# Patient Record
Sex: Female | Born: 1975 | Race: Black or African American | Hispanic: No | Marital: Single | Smoking: Light tobacco smoker
Health system: Southern US, Community
[De-identification: ages and names within clinical notes are randomized; demographics above are authoritative.]

## PROBLEM LIST (undated history)

## (undated) DIAGNOSIS — F329 Major depressive disorder, single episode, unspecified: Secondary | ICD-10-CM

## (undated) DIAGNOSIS — J45909 Unspecified asthma, uncomplicated: Secondary | ICD-10-CM

## (undated) DIAGNOSIS — F419 Anxiety disorder, unspecified: Secondary | ICD-10-CM

## (undated) DIAGNOSIS — F1020 Alcohol dependence, uncomplicated: Secondary | ICD-10-CM

## (undated) DIAGNOSIS — F32A Depression, unspecified: Secondary | ICD-10-CM

## (undated) HISTORY — DX: Alcohol dependence, uncomplicated: F10.20

## (undated) HISTORY — DX: Depression, unspecified: F32.A

## (undated) HISTORY — PX: INGUINAL HIDRADENITIS EXCISION: SHX1827

## (undated) HISTORY — DX: Unspecified asthma, uncomplicated: J45.909

## (undated) HISTORY — DX: Major depressive disorder, single episode, unspecified: F32.9

## (undated) HISTORY — PX: BREAST CYST ASPIRATION: SHX578

## (undated) HISTORY — PX: CYST REMOVAL NECK: SHX6281

## (undated) HISTORY — PX: KNEE SURGERY: SHX244

## (undated) HISTORY — DX: Anxiety disorder, unspecified: F41.9

---

## 2010-12-26 ENCOUNTER — Emergency Department (HOSPITAL_COMMUNITY): Payer: Self-pay

## 2010-12-26 ENCOUNTER — Encounter: Payer: Self-pay | Admitting: Emergency Medicine

## 2010-12-26 ENCOUNTER — Emergency Department (HOSPITAL_COMMUNITY)
Admission: EM | Admit: 2010-12-26 | Discharge: 2010-12-26 | Disposition: A | Discharge status: Home / Self Care | Payer: Self-pay | Attending: Emergency Medicine | Admitting: Emergency Medicine

## 2010-12-26 DIAGNOSIS — R269 Unspecified abnormalities of gait and mobility: Secondary | ICD-10-CM | POA: Insufficient documentation

## 2010-12-26 DIAGNOSIS — M25469 Effusion, unspecified knee: Secondary | ICD-10-CM | POA: Insufficient documentation

## 2010-12-26 DIAGNOSIS — S99919A Unspecified injury of unspecified ankle, initial encounter: Secondary | ICD-10-CM | POA: Insufficient documentation

## 2010-12-26 DIAGNOSIS — W010XXA Fall on same level from slipping, tripping and stumbling without subsequent striking against object, initial encounter: Secondary | ICD-10-CM | POA: Insufficient documentation

## 2010-12-26 DIAGNOSIS — S8990XA Unspecified injury of unspecified lower leg, initial encounter: Secondary | ICD-10-CM | POA: Insufficient documentation

## 2010-12-26 DIAGNOSIS — S8991XA Unspecified injury of right lower leg, initial encounter: Secondary | ICD-10-CM

## 2010-12-26 DIAGNOSIS — M25569 Pain in unspecified knee: Secondary | ICD-10-CM | POA: Insufficient documentation

## 2010-12-26 MED ORDER — IBUPROFEN 600 MG PO TABS: 600.0000 mg | ORAL_TABLET | Freq: Three times a day (TID) | ORAL | Status: AC

## 2010-12-26 MED ORDER — OXYCODONE-ACETAMINOPHEN 5-325 MG PO TABS
1.0000 | ORAL_TABLET | Freq: Once | ORAL | Status: AC
  Administered 2010-12-26: 1 via ORAL
  Filled 2010-12-26: qty 1

## 2010-12-26 MED ORDER — TRAMADOL HCL 50 MG PO TABS: 50.0000 mg | ORAL_TABLET | Freq: Four times a day (QID) | ORAL | Status: AC | PRN

## 2010-12-26 NOTE — Progress Notes (Signed)
Orthopedic Tech Progress Note Patient Details:  Sydney Beck 06, 1977 [id number]  Other Ortho Devices Type of Ortho Device: Knee Immobilizer;Crutches Ortho Device Location: right leg Ortho Device Interventions: Application   [name] 12/26/2010, 10:33 PM

## 2010-12-26 NOTE — ED Notes (Signed)
Pt discharged with immobilizer in place and crutches. Pt AOx4, resp e/u, NAD, pt states understanding of discharge instructions and denies questions at time of discharge.

## 2010-12-26 NOTE — ED Notes (Signed)
Ortho paged and will bring immobilizer and crutches to bedside.

## 2010-12-26 NOTE — ED Provider Notes (Signed)
Medical screening examination/treatment/procedure(s) were performed by non-physician practitioner and as supervising physician I was immediately available for consultation/collaboration.   Nelia Shi, MD 12/26/10 2322

## 2010-12-26 NOTE — ED Provider Notes (Signed)
History     CSN: [id number] Arrival date & time: 12/26/2010  6:28 PM   None     Chief Complaint  Patient presents with  . Fall  . Knee Pain    (Consider location/radiation/quality/duration/timing/severity/associated sxs/prior treatment) Patient is a 35 y.o. female presenting with fall and knee pain. The history is provided by the patient.  Fall The accident occurred 3 to 5 hours ago. The fall occurred while walking. She fell from a height of 3 to 5 ft. She landed on grass. There was no blood loss. The pain is present in the right knee. The pain is severe. She was ambulatory at the scene. There was no entrapment after the fall. There was no drug use involved in the accident. There was no alcohol use involved in the accident. Pertinent negatives include no fever, no numbness and no tingling. The symptoms are aggravated by activity and extension. She has tried NSAIDs for the symptoms. The treatment provided moderate relief.  Knee Pain Associated symptoms include joint swelling. Pertinent negatives include no chills, fever, numbness or weakness.  Pt was walking her dog on a hill PTA and slipped on wet hay that was placed on the ground. Fell to the ground and experienced twisting injury to right knee. Ambulatory immediately after incident but currently unable to bear weight.  History reviewed. No pertinent past medical history.  History reviewed. No pertinent past surgical history.  Family History  Problem Relation Age of Onset  . Diabetes Father     History  Substance Use Topics  . Smoking status: Not on file  . Smokeless tobacco: Not on file  . Alcohol Use:      Review of Systems  Constitutional: Negative for fever and chills.  Musculoskeletal: Positive for joint swelling and gait problem. Negative for back pain.       Positive right knee pain  Neurological: Negative for dizziness, tingling, syncope, weakness and numbness.  Hematological: Does not bruise/bleed easily.  All  other systems reviewed and are negative.    Allergies  Bee venom and Sulfa antibiotics  Home Medications   Current Outpatient Rx  Name Route Sig Dispense Refill  . ALBUTEROL SULFATE HFA 108 (90 BASE) MCG/ACT IN AERS Inhalation Inhale 2 puffs into the lungs every 6 (six) hours as needed. For shortness of breath     . IBUPROFEN 200 MG PO TABS Oral Take 200 mg by mouth every 6 (six) hours as needed. For pain       BP 123/78  Pulse 79  Temp(Src) 98.4 F (36.9 C) (Oral)  Resp 18  SpO2 100%  LMP 11/25/2010  Physical Exam  Constitutional: She is oriented to person, place, and time. She appears well-developed and well-nourished. No distress.  HENT:  Head: Normocephalic and atraumatic.  Eyes: Pupils are equal, round, and reactive to light.  Neck: Normal range of motion. Neck supple.  Cardiovascular: Normal rate.   Pulmonary/Chest: Effort normal and breath sounds normal.  Abdominal: Soft. She exhibits no distension. There is no tenderness.  Musculoskeletal:       Right knee: She exhibits decreased range of motion and swelling. She exhibits no ecchymosis, no laceration, no erythema, no LCL laxity, normal patellar mobility and no MCL laxity. tenderness found. Lateral joint line tenderness noted. No medial joint line and no patellar tendon tenderness noted.       All other joints with full active ROM without tenderness. Distal pulses 2+ bilaterally.  Neurological: She is alert and oriented to person, place,  and time. No cranial nerve deficit.  Skin: Skin is warm and dry. No rash noted. No erythema.  Psychiatric: Her behavior is normal.    ED Course  Procedures (including critical care time)  Labs Reviewed - No data to display Dg Knee Complete 4 Views Right  12/26/2010  *RADIOLOGY REPORT*  Clinical Data: Right knee pain after fall and twisting injury today.  RIGHT KNEE - COMPLETE 4+ VIEW  Comparison: None.  Findings: No fracture, dislocation, or appreciable joint effusion.   IMPRESSION: Normal exam.  Original Report Authenticated By: Gwynn Burly, M.D.     1. Right knee injury       MDM  X-ray reviewed, no acute findings. Have advised ortho f/u if no improvement in several days.        Elwyn Reach [place], [place] 12/26/10 2132

## 2010-12-26 NOTE — ED Notes (Signed)
Pt reports fell down a hill and twisted right knee.

## 2010-12-26 NOTE — ED Notes (Signed)
Pt transported to xray 

## 2011-04-21 ENCOUNTER — Encounter (HOSPITAL_COMMUNITY): Payer: Self-pay

## 2011-04-21 ENCOUNTER — Emergency Department (HOSPITAL_COMMUNITY)
Admission: EM | Admit: 2011-04-21 | Discharge: 2011-04-21 | Disposition: A | Discharge status: Home / Self Care | Payer: Self-pay | Attending: Emergency Medicine | Admitting: Emergency Medicine

## 2011-04-21 DIAGNOSIS — R12 Heartburn: Secondary | ICD-10-CM | POA: Insufficient documentation

## 2011-04-21 DIAGNOSIS — R42 Dizziness and giddiness: Secondary | ICD-10-CM | POA: Insufficient documentation

## 2011-04-21 DIAGNOSIS — R109 Unspecified abdominal pain: Secondary | ICD-10-CM | POA: Insufficient documentation

## 2011-04-21 DIAGNOSIS — R112 Nausea with vomiting, unspecified: Secondary | ICD-10-CM | POA: Insufficient documentation

## 2011-04-21 DIAGNOSIS — K2971 Gastritis, unspecified, with bleeding: Secondary | ICD-10-CM | POA: Insufficient documentation

## 2011-04-21 LAB — URINALYSIS, ROUTINE W REFLEX MICROSCOPIC
Glucose, UA: NEGATIVE mg/dL
Leukocytes, UA: NEGATIVE
Nitrite: NEGATIVE
Specific Gravity, Urine: 1.022 (ref 1.005–1.030)
pH: 8.5 — ABNORMAL HIGH (ref 5.0–8.0)

## 2011-04-21 LAB — POCT I-STAT, CHEM 8
Chloride: 109 mEq/L (ref 96–112)
Glucose, Bld: 84 mg/dL (ref 70–99)
HCT: 42 % (ref 36.0–46.0)
Hemoglobin: 14.3 g/dL (ref 12.0–15.0)
Potassium: 4 mEq/L (ref 3.5–5.1)

## 2011-04-21 LAB — POCT PREGNANCY, URINE: Preg Test, Ur: NEGATIVE

## 2011-04-21 MED ORDER — ONDANSETRON HCL 8 MG PO TABS: 8.0000 mg | ORAL_TABLET | Freq: Once | ORAL | Status: DC

## 2011-04-21 MED ORDER — GI COCKTAIL ~~LOC~~
30.0000 mL | Freq: Once | ORAL | Status: AC
  Administered 2011-04-21: 30 mL via ORAL
  Filled 2011-04-21: qty 30

## 2011-04-21 MED ORDER — FAMOTIDINE 20 MG PO TABS: 20.0000 mg | ORAL_TABLET | Freq: Two times a day (BID) | ORAL | Status: DC

## 2011-04-21 MED ORDER — ONDANSETRON HCL 4 MG/2ML IJ SOLN
INTRAMUSCULAR | Status: AC
  Administered 2011-04-21: 4 mg
  Filled 2011-04-21: qty 2

## 2011-04-21 MED ORDER — ONDANSETRON 8 MG PO TBDP: 8.0000 mg | ORAL_TABLET | Freq: Three times a day (TID) | ORAL | Status: AC | PRN

## 2011-04-21 MED ORDER — PANTOPRAZOLE SODIUM 40 MG IV SOLR
40.0000 mg | Freq: Once | INTRAVENOUS | Status: AC
  Administered 2011-04-21: 40 mg via INTRAVENOUS
  Filled 2011-04-21: qty 40

## 2011-04-21 NOTE — Discharge Instructions (Signed)
Gastritis °Gastritis is an inflammation (the body's way of reacting to injury and/or infection) of the stomach. It is often caused by viral or bacterial (germ) infections. It can also be caused by chemicals (including alcohol) and medications. This illness may be associated with generalized malaise (feeling tired, not well), cramps, and fever. The illness may last 2 to 7 days. If symptoms of gastritis continue, gastroscopy (looking into the stomach with a telescope-like instrument), biopsy (taking tissue samples), and/or blood tests may be necessary to determine the cause. Antibiotics will not affect the illness unless there is a bacterial infection present. One common bacterial cause of gastritis is an organism known as H. Pylori. This can be treated with antibiotics. Other forms of gastritis are caused by too much acid in the stomach. They can be treated with medications such as H2 blockers and antacids. Home treatment is usually all that is needed. Young children will quickly become dehydrated (loss of body fluids) if vomiting and diarrhea are both present. Medications may be given to control nausea. Medications are usually not given for diarrhea unless especially bothersome. Some medications slow the removal of the virus from the gastrointestinal tract. This slows down the healing process. °HOME CARE INSTRUCTIONS °Home care instructions for nausea and vomiting: °· For adults: drink small amounts of fluids often. Drink at least 2 quarts a day. Take sips frequently. Do not drink large amounts of fluid at one time. This may worsen the nausea.  °· Only take over-the-counter or prescription medicines for pain, discomfort, or fever as directed by your caregiver.  °· Drink clear liquids only. Those are anything you can see through such as water, broth, or soft drinks.  °· Once you are keeping clear liquids down, you may start full liquids, soups, juices, and ice cream or sherbet. Slowly add bland (plain, not spicy)  foods to your diet.  °Home care instructions for diarrhea: °· Diarrhea can be caused by bacterial infections or a virus. Your condition should improve with time, rest, fluids, and/or anti-diarrheal medication.  °· Until your diarrhea is under control, you should drink clear liquids often in small amounts. Clear liquids include: water, broth, jell-o water and weak tea.  °Avoid: °· Milk.  °· Fruits.  °· Tobacco.  °· Alcohol.  °· Extremely hot or cold fluids.  °· Too much intake of anything at one time.  °When your diarrhea stops you may add the following foods, which help the stool to become more formed: °· Rice.  °· Bananas.  °· Apples without skin.  °· Dry toast.  °Once these foods are tolerated you may add low-fat yogurt and low-fat cottage cheese. They will help to restore the normal bacterial balance in your bowel. °Wash your hands well to avoid spreading bacteria (germ) or virus. °SEEK IMMEDIATE MEDICAL CARE IF:  °· You are unable to keep fluids down.  °· Vomiting or diarrhea become persistent (constant).  °· Abdominal pain develops, increases, or localizes. (Right sided pain can be appendicitis. Left sided pain in adults can be diverticulitis.)  °· You develop a fever (an oral temperature above 102° F (38.9° C)).  °· Diarrhea becomes excessive or contains blood or mucus.  °· You have excessive weakness, dizziness, fainting or extreme thirst.  °· You are not improving or you are getting worse.  °· You have any other questions or concerns.  °Document Released: 01/25/2001 Document Revised: 01/20/2011 Document Reviewed: 01/31/2005 °ExitCare® Patient Information ©2012 [employer]. ° °RESOURCE GUIDE ° °Dental Problems ° °Patients with   Medicaid: °[facility]                     [facility] °[address]                                           [address] °Phone:  [phone]                                                  Phone:  [phone] ° °If unable to pay or uninsured, contact:   [facility] or [facility] to become qualified for the adult dental clinic. ° °Chronic Pain Problems °Contact [facility]  [phone] °Patients need to be referred by their primary care doctor. ° °Insufficient Money for Medicine °Contact [employer]:  call "211" or [facility] 271-5999. ° °No Primary Care Doctor °Call [facility]  832-8000 °Other agencies that provide inexpensive medical care °   [facility]  [phone] °   [facility]  832-7272 °   [facility]  [phone] °   [facility]  832-4777 °   [facility]  [phone] °   [facility]  [phone] ° °Psychological Services °[facility]  [phone] °[facility]  [phone] °[facility]   [phone] (emergency services [phone]) ° °Substance Abuse Resources °[facility]  336-882-2125 °[facility] 336-784-9470 °[facility] 336-285-9073 °[facility] 336-845-3988 °Residential & Outpatient Substance Abuse Program  800-659-3381 ° °Abuse/Neglect °[place] [facility] (336) 641-3795 °[place] [facility] Hotline 800-378-5315 (After Hours) ° °Emergency Shelter °[facility] (336) 271-5985 ° °Maternity Homes °[facility] (336) 275-9566 °[facility] (704) 372-4663 ° °MRSA Hotline #:   832-7006 ° ° ° °[place] [facility] ° °[facility] [facility]     [employer]                          [facility] °[address] [place]                       [address]      [address]  °[place]                                                [place]                            [place] °Phone:  [phone]                                   Phone:  [phone]                 Phone:  342-8140 ° °[facility] °Phone:  342-8316 ° °[place] Child [facility] Hotline °(336)    342-1394 °(336) 342-3537 (After Hours) ° ° °

## 2011-04-21 NOTE — ED Notes (Signed)
Pt states that she was drinking tequila last night and ever since she woke up this morning she has been having dizziness, nausea, vomiting, and upper abdominal pain. Alert and oriented. She tried to tolerate gatorade and antacid with no relief.

## 2011-04-21 NOTE — ED Provider Notes (Signed)
History     CSN: [id number]  Arrival date & time 04/21/11  1626   First MD Initiated Contact with Patient 04/21/11 1741      Chief Complaint  Patient presents with  . Dizziness  . Abdominal Pain  . Nausea    (Consider location/radiation/quality/duration/timing/severity/associated sxs/prior treatment) Patient is a 36 y.o. female presenting with abdominal pain. The history is provided by the patient. No language interpreter was used.  Abdominal Pain The primary symptoms of the illness include abdominal pain, nausea and vomiting. The current episode started 6 to 12 hours ago. The onset of the illness was sudden. The problem has been gradually worsening.  The illness is associated with alcohol use. The patient has not had a change in bowel habit. Additional symptoms associated with the illness include heartburn.    History reviewed. No pertinent past medical history.  History reviewed. No pertinent past surgical history.  Family History  Problem Relation Age of Onset  . Diabetes Father     History  Substance Use Topics  . Smoking status: Former Games developer  . Smokeless tobacco: Not on file  . Alcohol Use: Yes    OB History    Grav Para Term Preterm Abortions TAB SAB Ect Mult Living                  Review of Systems  Gastrointestinal: Positive for heartburn, nausea, vomiting and abdominal pain.  All other systems reviewed and are negative.    Allergies  Bee venom and Sulfa antibiotics  Home Medications   Current Outpatient Rx  Name Route Sig Dispense Refill  . ALBUTEROL SULFATE HFA 108 (90 BASE) MCG/ACT IN AERS Inhalation Inhale 2 puffs into the lungs every 6 (six) hours as needed. For shortness of breath     . APPLE CIDER VINEGAR 188 MG PO CAPS Oral Take 1 capsule by mouth daily.    . B COMPLEX PO TABS Oral Take 1 tablet by mouth daily.    . IBUPROFEN 200 MG PO TABS Oral Take 200 mg by mouth every 6 (six) hours as needed. For pain     . ADULT MULTIVITAMIN  W/MINERALS CH Oral Take 1 tablet by mouth daily.      BP 119/70  Pulse 74  Resp 16  SpO2 100%  LMP 03/30/2011  Physical Exam  Nursing note and vitals reviewed. Constitutional: She is oriented to person, place, and time. She appears well-developed and well-nourished.  HENT:  Head: Normocephalic and atraumatic.  Eyes: Conjunctivae are normal. Pupils are equal, round, and reactive to light.  Neck: Normal range of motion. Neck supple.  Cardiovascular: Normal rate, regular rhythm, normal heart sounds and intact distal pulses.   Pulmonary/Chest: Effort normal and breath sounds normal.  Abdominal: Soft. She exhibits no distension. There is no rebound and no guarding.  Musculoskeletal: Normal range of motion.  Neurological: She is alert and oriented to person, place, and time.  Skin: Skin is warm and dry.  Psychiatric: She has a normal mood and affect. Her behavior is normal. Judgment and thought content normal.    ED Course  Procedures (including critical care time)  Labs Reviewed - No data to display No results found.   No diagnosis found.  Likely gastritis.  Will check urine and istat.  Patient has received zofran, fluid bolus, protonix, GI cocktail.  Tolerating ice chips without difficulty at present.  Nausea controlled. Feels better.  MDM          Jimmye Norman,  NP 04/21/11 2004

## 2011-04-22 NOTE — ED Provider Notes (Signed)
Medical screening examination/treatment/procedure(s) were performed by non-physician practitioner and as supervising physician I was immediately available for consultation/collaboration.  Toy Baker, MD 04/22/11 [phone]

## 2011-06-14 ENCOUNTER — Emergency Department (HOSPITAL_COMMUNITY): Payer: Self-pay

## 2011-06-14 ENCOUNTER — Encounter (HOSPITAL_COMMUNITY): Payer: Self-pay | Admitting: Emergency Medicine

## 2011-06-14 ENCOUNTER — Emergency Department (HOSPITAL_COMMUNITY)
Admission: EM | Admit: 2011-06-14 | Discharge: 2011-06-14 | Disposition: A | Discharge status: Home / Self Care | Payer: Self-pay | Attending: Emergency Medicine | Admitting: Emergency Medicine

## 2011-06-14 DIAGNOSIS — S8390XA Sprain of unspecified site of unspecified knee, initial encounter: Secondary | ICD-10-CM

## 2011-06-14 DIAGNOSIS — IMO0002 Reserved for concepts with insufficient information to code with codable children: Secondary | ICD-10-CM | POA: Insufficient documentation

## 2011-06-14 DIAGNOSIS — W19XXXA Unspecified fall, initial encounter: Secondary | ICD-10-CM | POA: Insufficient documentation

## 2011-06-14 MED ORDER — OXYCODONE-ACETAMINOPHEN 5-325 MG PO TABS
1.0000 | ORAL_TABLET | Freq: Once | ORAL | Status: AC
  Administered 2011-06-14: 1 via ORAL
  Filled 2011-06-14: qty 1

## 2011-06-14 MED ORDER — NAPROXEN 500 MG PO TABS: 500.0000 mg | ORAL_TABLET | Freq: Two times a day (BID) | ORAL | Status: AC

## 2011-06-14 MED ORDER — OXYCODONE-ACETAMINOPHEN 5-325 MG PO TABS: 1.0000 | ORAL_TABLET | Freq: Four times a day (QID) | ORAL | Status: AC | PRN

## 2011-06-14 NOTE — ED Notes (Signed)
Reports injured right knee in Nov, got better but never completely back to normal, 6 days ago sat funny & it "locked up" on her. Now c/o pain & difficulty ambulating due to pain. No obvious edema, states is using a walker

## 2011-06-14 NOTE — ED Notes (Signed)
Ortho tech reports did not have knee sleeve to fit pt. Ace wrap applied in replacement

## 2011-06-14 NOTE — ED Notes (Signed)
Patient transported to X-ray 

## 2011-06-14 NOTE — ED Provider Notes (Signed)
History     CSN: [id number]  Arrival date & time 06/14/11  1542   First MD Initiated Contact with Patient 06/14/11 1700      Chief Complaint  Patient presents with  . Leg Pain    (Consider location/radiation/quality/duration/timing/severity/associated sxs/prior treatment) Patient is a 36 y.o. female presenting with leg pain. The history is provided by the patient (pt states her right knee hurt when she straightened it .). No language interpreter was used.  Leg Pain  The incident occurred more than 2 days ago. The incident occurred at home. Injury mechanism: injured with use. The pain is present in the right knee. The quality of the pain is described as aching. The pain is at a severity of 4/10. The pain is moderate. Associated symptoms include inability to bear weight. Pertinent negatives include no numbness. She reports no foreign bodies present. The symptoms are aggravated by activity. The treatment provided moderate relief.    History reviewed. No pertinent past medical history.  History reviewed. No pertinent past surgical history.  Family History  Problem Relation Age of Onset  . Diabetes Father     History  Substance Use Topics  . Smoking status: Former Games developer  . Smokeless tobacco: Not on file  . Alcohol Use: Yes    OB History    Grav Para Term Preterm Abortions TAB SAB Ect Mult Living                  Review of Systems  Constitutional: Negative for fatigue.  HENT: Negative for congestion, sinus pressure and ear discharge.   Eyes: Negative for discharge.  Respiratory: Negative for cough.   Cardiovascular: Negative for chest pain.  Gastrointestinal: Negative for abdominal pain and diarrhea.  Genitourinary: Negative for frequency and hematuria.  Musculoskeletal: Positive for joint swelling. Negative for back pain.  Skin: Negative for rash.  Neurological: Negative for seizures, numbness and headaches.  Hematological: Negative.   Psychiatric/Behavioral:  Negative for hallucinations.    Allergies  Bee venom and Sulfa antibiotics  Home Medications   Current Outpatient Rx  Name Route Sig Dispense Refill  . FLUTICASONE PROPIONATE 50 MCG/ACT NA SUSP Nasal Place 2 sprays into the nose daily.    Marland Kitchen NAPROXEN 500 MG PO TABS Oral Take 1 tablet (500 mg total) by mouth 2 (two) times daily. 30 tablet 0  . OXYCODONE-ACETAMINOPHEN 5-325 MG PO TABS Oral Take 1 tablet by mouth every 6 (six) hours as needed for pain. 30 tablet 0    BP 110/73  Pulse 71  Temp(Src) 98.1 F (36.7 C) (Oral)  Resp 20  SpO2 100%  LMP 05/25/2011  Physical Exam  Constitutional: She is oriented to person, place, and time. She appears well-developed.  HENT:  Head: Normocephalic.  Eyes: Conjunctivae are normal.  Neck: No tracheal deviation present.  Cardiovascular:  No murmur heard. Musculoskeletal:       Swollen tender right knee  Neurological: She is oriented to person, place, and time.  Skin: Skin is warm.  Psychiatric: She has a normal mood and affect.    ED Course  Procedures (including critical care time)  Labs Reviewed - No data to display Dg Knee Complete 4 Views Right  06/14/2011  *RADIOLOGY REPORT*  Clinical Data: Post fall 6 months ago with persistent popping in the left knee  RIGHT KNEE - COMPLETE 4+ VIEW  Comparison: 12/26/2010  Findings: No fracture dislocation.  Joint spaces are preserved.  No evidence of chondrocalcinosis.  Interval development of a small suprapatellar  joint effusion.  Regional soft tissues are normal.  IMPRESSION: Small suprapatellar joint effusion otherwise, unremarkable examination of the knee.  Original Report Authenticated By: Waynard Reeds, M.D.     1. Knee sprain       MDM          Benny Lennert, MD 06/14/11 Rickey Primus

## 2011-06-14 NOTE — ED Notes (Signed)
Onset November 2012 right knee pain. States couple of days ago sitting on legs and extended right knee it it locked up and painful. Pain 7/10 achy dull pain right knee.

## 2011-06-14 NOTE — ED Notes (Signed)
ortho tech informed of need for knee sleeve

## 2011-06-14 NOTE — Discharge Instructions (Signed)
Follow up with dr. duda next week. °

## 2013-06-25 ENCOUNTER — Emergency Department (HOSPITAL_COMMUNITY)
Admission: EM | Admit: 2013-06-25 | Discharge: 2013-06-25 | Disposition: A | Discharge status: Home / Self Care | Payer: Managed Care, Other (non HMO) | Attending: Emergency Medicine | Admitting: Emergency Medicine

## 2013-06-25 ENCOUNTER — Encounter (HOSPITAL_COMMUNITY): Payer: Self-pay | Admitting: Emergency Medicine

## 2013-06-25 DIAGNOSIS — K2990 Gastroduodenitis, unspecified, without bleeding: Principal | ICD-10-CM

## 2013-06-25 DIAGNOSIS — E860 Dehydration: Secondary | ICD-10-CM | POA: Insufficient documentation

## 2013-06-25 DIAGNOSIS — IMO0002 Reserved for concepts with insufficient information to code with codable children: Secondary | ICD-10-CM | POA: Insufficient documentation

## 2013-06-25 DIAGNOSIS — K2970 Gastritis, unspecified, without bleeding: Secondary | ICD-10-CM

## 2013-06-25 DIAGNOSIS — Z87891 Personal history of nicotine dependence: Secondary | ICD-10-CM | POA: Insufficient documentation

## 2013-06-25 DIAGNOSIS — Z3202 Encounter for pregnancy test, result negative: Secondary | ICD-10-CM | POA: Insufficient documentation

## 2013-06-25 DIAGNOSIS — R1110 Vomiting, unspecified: Secondary | ICD-10-CM

## 2013-06-25 DIAGNOSIS — Z792 Long term (current) use of antibiotics: Secondary | ICD-10-CM | POA: Insufficient documentation

## 2013-06-25 LAB — CBC WITH DIFFERENTIAL/PLATELET
Basophils Absolute: 0 10*3/uL (ref 0.0–0.1)
Basophils Relative: 1 % (ref 0–1)
EOS ABS: 0.1 10*3/uL (ref 0.0–0.7)
EOS PCT: 2 % (ref 0–5)
HCT: 40.1 % (ref 36.0–46.0)
Hemoglobin: 13.1 g/dL (ref 12.0–15.0)
LYMPHS ABS: 2.2 10*3/uL (ref 0.7–4.0)
Lymphocytes Relative: 44 % (ref 12–46)
MCH: 29 pg (ref 26.0–34.0)
MCHC: 32.7 g/dL (ref 30.0–36.0)
MCV: 88.7 fL (ref 78.0–100.0)
Monocytes Absolute: 0.3 10*3/uL (ref 0.1–1.0)
Monocytes Relative: 6 % (ref 3–12)
Neutro Abs: 2.5 10*3/uL (ref 1.7–7.7)
Neutrophils Relative %: 48 % (ref 43–77)
PLATELETS: 207 10*3/uL (ref 150–400)
RBC: 4.52 MIL/uL (ref 3.87–5.11)
RDW: 14.2 % (ref 11.5–15.5)
WBC: 5.1 10*3/uL (ref 4.0–10.5)

## 2013-06-25 LAB — COMPREHENSIVE METABOLIC PANEL
ALT: 18 U/L (ref 0–35)
AST: 23 U/L (ref 0–37)
Albumin: 3.5 g/dL (ref 3.5–5.2)
Alkaline Phosphatase: 55 U/L (ref 39–117)
BUN: 10 mg/dL (ref 6–23)
CALCIUM: 9.3 mg/dL (ref 8.4–10.5)
CO2: 28 mEq/L (ref 19–32)
Chloride: 102 mEq/L (ref 96–112)
Creatinine, Ser: 0.82 mg/dL (ref 0.50–1.10)
GFR calc non Af Amer: >90 mL/min (ref >90)
GLUCOSE: 105 mg/dL — AB (ref 70–99)
Potassium: 4 mEq/L (ref 3.7–5.3)
SODIUM: 141 meq/L (ref 137–147)
TOTAL PROTEIN: 7.5 g/dL (ref 6.0–8.3)
Total Bilirubin: <0.2 mg/dL — ABNORMAL LOW (ref 0.3–1.2)

## 2013-06-25 LAB — CBC
HCT: 38.3 % (ref 36.0–46.0)
Hemoglobin: 12.4 g/dL (ref 12.0–15.0)
MCH: 28.6 pg (ref 26.0–34.0)
MCHC: 32.4 g/dL (ref 30.0–36.0)
MCV: 88.2 fL (ref 78.0–100.0)
Platelets: 227 10*3/uL (ref 150–400)
RBC: 4.34 MIL/uL (ref 3.87–5.11)
RDW: 14.2 % (ref 11.5–15.5)
WBC: 4.9 10*3/uL (ref 4.0–10.5)

## 2013-06-25 LAB — URINE MICROSCOPIC-ADD ON

## 2013-06-25 LAB — URINALYSIS, ROUTINE W REFLEX MICROSCOPIC
Bilirubin Urine: NEGATIVE
Glucose, UA: NEGATIVE mg/dL
Hgb urine dipstick: NEGATIVE
Ketones, ur: NEGATIVE mg/dL
LEUKOCYTES UA: NEGATIVE
NITRITE: NEGATIVE
PH: 7 (ref 5.0–8.0)
Protein, ur: 30 mg/dL — AB
SPECIFIC GRAVITY, URINE: 1.025 (ref 1.005–1.030)
Urobilinogen, UA: 0.2 mg/dL (ref 0.0–1.0)

## 2013-06-25 LAB — ETHANOL: Alcohol, Ethyl (B): <11 mg/dL (ref 0–11)

## 2013-06-25 LAB — POC URINE PREG, ED: Preg Test, Ur: NEGATIVE

## 2013-06-25 LAB — LIPASE, BLOOD: LIPASE: 29 U/L (ref 11–59)

## 2013-06-25 MED ORDER — ONDANSETRON 8 MG PO TBDP
8.0000 mg | ORAL_TABLET | Freq: Once | ORAL | Status: AC
  Administered 2013-06-25: 8 mg via ORAL
  Filled 2013-06-25: qty 1

## 2013-06-25 MED ORDER — ACETAMINOPHEN 500 MG PO TABS
1000.0000 mg | ORAL_TABLET | Freq: Once | ORAL | Status: AC
  Administered 2013-06-25: 1000 mg via ORAL
  Filled 2013-06-25: qty 2

## 2013-06-25 MED ORDER — ONDANSETRON HCL 4 MG/2ML IJ SOLN
4.0000 mg | Freq: Once | INTRAMUSCULAR | Status: DC
Start: 2013-06-25 — End: 2013-06-25

## 2013-06-25 MED ORDER — DIPHENHYDRAMINE HCL 50 MG/ML IJ SOLN
25.0000 mg | Freq: Once | INTRAMUSCULAR | Status: AC
  Administered 2013-06-25: 25 mg via INTRAVENOUS
  Filled 2013-06-25: qty 1

## 2013-06-25 MED ORDER — ONDANSETRON HCL 4 MG/2ML IJ SOLN
4.0000 mg | Freq: Four times a day (QID) | INTRAMUSCULAR | Status: DC | PRN
  Administered 2013-06-25: 4 mg via INTRAVENOUS
  Filled 2013-06-25: qty 2

## 2013-06-25 MED ORDER — METOCLOPRAMIDE HCL 10 MG PO TABS: 10.0000 mg | ORAL_TABLET | Freq: Four times a day (QID) | ORAL | Status: DC | PRN

## 2013-06-25 MED ORDER — METOCLOPRAMIDE HCL 5 MG/ML IJ SOLN
10.0000 mg | Freq: Once | INTRAMUSCULAR | Status: AC
  Administered 2013-06-25: 10 mg via INTRAVENOUS
  Filled 2013-06-25: qty 2

## 2013-06-25 MED ORDER — SODIUM CHLORIDE 0.9 % IV BOLUS (SEPSIS)
1000.0000 mL | Freq: Once | INTRAVENOUS | Status: AC
  Administered 2013-06-25: 1000 mL via INTRAVENOUS

## 2013-06-25 NOTE — ED Notes (Signed)
Pt unable to void at this time, 

## 2013-06-25 NOTE — Discharge Instructions (Signed)
Avoid alcohol.  Stay hydrated.   Eat BRAT diet.   Take reglan for headache or nausea.   Follow up with your doctor.   Return to ER if you have vomiting blood, fever, severe pain.

## 2013-06-25 NOTE — ED Notes (Signed)
Dr.yao wants cbc in 20 imns from now

## 2013-06-25 NOTE — ED Notes (Signed)
Pt states that she ate chicken pie and drank rum, then started vomiting about 3am

## 2013-06-25 NOTE — ED Provider Notes (Signed)
CSN: [id number]     Arrival date & time 06/25/13  0515 History   First MD Initiated Contact with Patient 06/25/13 [phone]     Chief Complaint  Patient presents with  . Emesis     (Consider location/radiation/quality/duration/timing/severity/associated sxs/prior Treatment) The history is provided by the patient.  Sydney Beck is a 38 y.o. female otherwise healthy here presenting with abdominal pain and vomiting. Ate some chicken sorting some rum around 6 PM last night. Around 3 AM started vomiting. Initially it was clear and then he became blood tinged after the fifth and sixth episode. Some epigastric pain with vomiting but no pain otherwise. Denies fevers or chills or diarrhea.    History reviewed. No pertinent past medical history. History reviewed. No pertinent past surgical history. Family History  Problem Relation Age of Onset  . Diabetes Father    History  Substance Use Topics  . Smoking status: Former Research scientist (life sciences)  . Smokeless tobacco: Not on file  . Alcohol Use: Yes   OB History   Grav Para Term Preterm Abortions TAB SAB Ect Mult Living                 Review of Systems  Gastrointestinal: Positive for vomiting.  All other systems reviewed and are negative.     Allergies  Bee venom and Sulfa antibiotics  Home Medications   Prior to Admission medications   Medication Sig Start Date End Date Taking? Authorizing Provider  metroNIDAZOLE (FLAGYL) 500 MG tablet Take 500 mg by mouth 2 (two) times daily. & day course   Yes Historical Provider, MD  mometasone (NASONEX) 50 MCG/ACT nasal spray Place 2 sprays into the nose daily.   Yes Historical Provider, MD   BP 106/62  Pulse 62  Temp(Src) 97.8 F (36.6 C) (Oral)  Resp 16  Ht 5\' 9"  (1.753 m)  Wt 268 lb (121.564 kg)  BMI 39.56 kg/m2  SpO2 100%  LMP 06/25/2013 Physical Exam  Nursing note and vitals reviewed. Constitutional: She is oriented to person, place, and time.  Uncomfortable, dehydrated   HENT:  Head:  Normocephalic.  MM dry   Eyes: Conjunctivae are normal. Pupils are equal, round, and reactive to light.  Neck: Normal range of motion. Neck supple.  Cardiovascular: Normal rate, regular rhythm and normal heart sounds.   Pulmonary/Chest: Effort normal and breath sounds normal. No respiratory distress. She has no wheezes. She has no rales.  Abdominal: Soft. Bowel sounds are normal. She exhibits no distension. There is no tenderness. There is no rebound and no guarding.  Musculoskeletal: Normal range of motion.  Neurological: She is alert and oriented to person, place, and time. No cranial nerve deficit. Coordination normal.  Skin: Skin is warm and dry.  Psychiatric: She has a normal mood and affect. Her behavior is normal. Judgment and thought content normal.    ED Course  Procedures (including critical care time) Labs Review Labs Reviewed  COMPREHENSIVE METABOLIC PANEL - Abnormal; Notable for the following:    Glucose, Bld 105 (*)    Total Bilirubin <0.2 (*)    All other components within normal limits  URINALYSIS, ROUTINE W REFLEX MICROSCOPIC - Abnormal; Notable for the following:    APPearance CLOUDY (*)    Protein, ur 30 (*)    All other components within normal limits  URINE MICROSCOPIC-ADD ON - Abnormal; Notable for the following:    Squamous Epithelial / LPF MANY (*)    Bacteria, UA MANY (*)    All other components  within normal limits  CBC WITH DIFFERENTIAL  LIPASE, BLOOD  ETHANOL  CBC  POC URINE PREG, ED    Imaging Review No results found.   EKG Interpretation None      MDM   Final diagnoses:  None    Sydney Beck is a 38 y.o. female here with vomiting. Likely alcoholic gastritis vs mallory weiss. Hg stable. Will give zofran and IVF and reassess.   11:11 AM Felt better, tolerated PO. 4 hr CBC stable (Hg dec to 12 from 13, but given 1 L NS). Will d/c home on reglan. Likely alcoholic gastritis. I told her not to drink alcohol.    Wandra Arthurs,  MD 06/25/13 1113

## 2013-06-25 NOTE — ED Notes (Signed)
Patient is resting comfortably. 

## 2014-03-17 DIAGNOSIS — E049 Nontoxic goiter, unspecified: Secondary | ICD-10-CM | POA: Insufficient documentation

## 2014-03-18 ENCOUNTER — Other Ambulatory Visit: Payer: Self-pay | Admitting: Family Medicine

## 2014-03-18 DIAGNOSIS — Q892 Congenital malformations of other endocrine glands: Secondary | ICD-10-CM

## 2014-03-24 ENCOUNTER — Ambulatory Visit
Admission: RE | Admit: 2014-03-24 | Discharge: 2014-03-24 | Disposition: A | Discharge status: Home / Self Care | Payer: BLUE CROSS/BLUE SHIELD | Source: Ambulatory Visit | Attending: Family Medicine | Admitting: Family Medicine

## 2014-03-24 DIAGNOSIS — Q892 Congenital malformations of other endocrine glands: Secondary | ICD-10-CM

## 2014-03-27 ENCOUNTER — Other Ambulatory Visit: Payer: Self-pay | Admitting: Family Medicine

## 2014-03-27 DIAGNOSIS — Q892 Congenital malformations of other endocrine glands: Secondary | ICD-10-CM

## 2014-03-28 ENCOUNTER — Other Ambulatory Visit: Payer: BLUE CROSS/BLUE SHIELD

## 2014-03-31 ENCOUNTER — Other Ambulatory Visit: Payer: BLUE CROSS/BLUE SHIELD

## 2014-04-01 ENCOUNTER — Inpatient Hospital Stay: Admission: RE | Admit: 2014-04-01 | Payer: BLUE CROSS/BLUE SHIELD | Source: Ambulatory Visit

## 2014-05-06 ENCOUNTER — Ambulatory Visit: Payer: Self-pay | Admitting: Surgery

## 2014-09-10 ENCOUNTER — Encounter (HOSPITAL_BASED_OUTPATIENT_CLINIC_OR_DEPARTMENT_OTHER): Payer: Self-pay | Admitting: Surgery

## 2014-09-10 DIAGNOSIS — R221 Localized swelling, mass and lump, neck: Secondary | ICD-10-CM | POA: Diagnosis present

## 2014-09-10 NOTE — H&P (Signed)
General Surgery [facility]  Sydney Beck DOB: 03-11-1975 Single / Language: [place] / Race: Black or African American Female  History of Present Illness Patient words: enlarged lymphnode.  The patient is a 39 year old female who presents with a neck mass. Patient is referred by Dr. Milagros Evener for evaluation of an anterior neck mass. Patient first noted this approximately 1 year ago on self-examination. It has gradually increased in size. It does not cause discomfort. She has had no drainage. Patient underwent ultrasound examination in February 2016. This showed a 14 mm rounded soft tissue nodule in the submental space. Etiology was uncertain. Examination of the thyroid by ultrasound showed bilateral small thyroid nodules without worrisome features. Clinical examination was recommended as follow-up. Patient has had no prior head or neck surgery. She has never been on thyroid medication. There is a family history of Graves' disease and the patient's mother. There is no family history of other endocrine neoplasms. Patient denies any B symptoms. She has no other areas of lymphadenopathy.   Other Problems  Anxiety Disorder Arthritis Asthma Hemorrhoids Migraine Headache  Past Surgical History Knee Surgery Right. Oral Surgery  Diagnostic Studies History Colonoscopy never Mammogram never Pap Smear 1-5 years ago  Allergies Sulfa 10 *OPHTHALMIC AGENTS* Sulfa Antibiotics  Medication History  Multivitamin (Oral) Active. Medications Reconciled  Social History Alcohol use Moderate alcohol use. Caffeine use Tea. Illicit drug use Prefer to discuss with provider. Tobacco use Current some day smoker.  Family History Arthritis Mother. Diabetes Mellitus Father. Prostate Cancer Family Members In General. Thyroid problems Mother.  Pregnancy / Birth History Age at menarche 81 years. Gravida 0 Para 0 Regular  periods  Review of Systems General Present- Fatigue and Night Sweats. Not Present- Appetite Loss, Chills, Fever, Weight Gain and Weight Loss. Skin Not Present- Change in Wart/Mole, Dryness, Hives, Jaundice, New Lesions, Non-Healing Wounds, Rash and Ulcer. HEENT Present- Seasonal Allergies and Wears glasses/contact lenses. Not Present- Earache, Hearing Loss, Hoarseness, Nose Bleed, Oral Ulcers, Ringing in the Ears, Sinus Pain, Sore Throat, Visual Disturbances and Yellow Eyes. Respiratory Not Present- Bloody sputum, Chronic Cough, Difficulty Breathing, Snoring and Wheezing. Breast Not Present- Breast Mass, Breast Pain, Nipple Discharge and Skin Changes. Cardiovascular Not Present- Chest Pain, Difficulty Breathing Lying Down, Leg Cramps, Palpitations, Rapid Heart Rate, Shortness of Breath and Swelling of Extremities. Gastrointestinal Not Present- Abdominal Pain, Bloating, Bloody Stool, Change in Bowel Habits, Chronic diarrhea, Constipation, Difficulty Swallowing, Excessive gas, Gets full quickly at meals, Hemorrhoids, Indigestion, Nausea, Rectal Pain and Vomiting. Female Genitourinary Not Present- Frequency, Nocturia, Painful Urination, Pelvic Pain and Urgency. Musculoskeletal Not Present- Back Pain, Joint Pain, Joint Stiffness, Muscle Pain, Muscle Weakness and Swelling of Extremities. Neurological Not Present- Decreased Memory, Fainting, Headaches, Numbness, Seizures, Tingling, Tremor, Trouble walking and Weakness. Psychiatric Not Present- Anxiety, Bipolar, Change in Sleep Pattern, Depression, Fearful and Frequent crying. Endocrine Not Present- Cold Intolerance, Excessive Hunger, Hair Changes, Heat Intolerance, Hot flashes and New Diabetes. Hematology Not Present- Easy Bruising, Excessive bleeding, Gland problems, HIV and Persistent Infections.   Vitals Weight: 220.5 lb Height: 68in Body Surface Area: 2.19 m Body Mass Index: 33.53 kg/m Temp.: 98.25F  Pulse: 68 (Regular)  BP: 118/72  (Sitting, Left Arm, Standard)    Physical Exam   General - appears comfortable, no distress; not diaphorectic  HEENT - normocephalic; sclerae clear, gaze conjugate; mucous membranes moist, dentition good; voice normal  Neck - symmetric on extension; no palpable anterior or posterior cervical adenopathy; no palpable masses in  the thyroid bed; there is a smooth rounded mobile 1.5 cm mass in the submental space near the midline which is nontender  Chest - clear bilaterally with rhonchi, rales, or wheeze  Cor - regular rhythm with normal rate; no significant murmur  Ext - non-tender without significant edema or lymphedema  Neuro - grossly intact; no tremor   Assessment & Plan  SUBMENTAL MASS (784.2  R22.1)  Patient has a 1.5 cm mass in the submental space. This may represent lymph node versus epidermal inclusion cyst.  Patient and I discussed options for management. She would like to proceed with surgical excision for definitive diagnosis. I think that is appropriate. We discussed the procedure which can be done as an outpatient. We discussed potential complications. We discussed the location of the surgical incision. Patient understands and wishes to proceed in the near future.  The risks and benefits of the procedure have been discussed at length with the patient. The patient understands the proposed procedure, potential alternative treatments, and the course of recovery to be expected. All of the patient's questions have been answered at this time. The patient wishes to proceed with surgery.  Earnstine Regal, MD, [facility] Office: [phone]

## 2014-09-12 ENCOUNTER — Encounter (HOSPITAL_BASED_OUTPATIENT_CLINIC_OR_DEPARTMENT_OTHER): Admission: RE | Payer: Self-pay | Source: Ambulatory Visit

## 2014-09-12 ENCOUNTER — Ambulatory Visit (HOSPITAL_BASED_OUTPATIENT_CLINIC_OR_DEPARTMENT_OTHER): Admission: RE | Admit: 2014-09-12 | Payer: BLUE CROSS/BLUE SHIELD | Source: Ambulatory Visit | Admitting: Surgery

## 2014-09-12 SURGERY — EXCISION MASS
Anesthesia: General | Site: Neck

## 2015-05-19 DIAGNOSIS — F4320 Adjustment disorder, unspecified: Secondary | ICD-10-CM | POA: Diagnosis not present

## 2015-05-26 DIAGNOSIS — F4320 Adjustment disorder, unspecified: Secondary | ICD-10-CM | POA: Diagnosis not present

## 2015-05-27 DIAGNOSIS — H01009 Unspecified blepharitis unspecified eye, unspecified eyelid: Secondary | ICD-10-CM | POA: Diagnosis not present

## 2015-05-27 DIAGNOSIS — H00023 Hordeolum internum right eye, unspecified eyelid: Secondary | ICD-10-CM | POA: Diagnosis not present

## 2015-05-27 DIAGNOSIS — J309 Allergic rhinitis, unspecified: Secondary | ICD-10-CM | POA: Diagnosis not present

## 2015-05-29 DIAGNOSIS — H1031 Unspecified acute conjunctivitis, right eye: Secondary | ICD-10-CM | POA: Diagnosis not present

## 2015-06-04 DIAGNOSIS — L729 Follicular cyst of the skin and subcutaneous tissue, unspecified: Secondary | ICD-10-CM | POA: Diagnosis not present

## 2015-06-04 DIAGNOSIS — D210 Benign neoplasm of connective and other soft tissue of head, face and neck: Secondary | ICD-10-CM | POA: Diagnosis not present

## 2015-06-04 DIAGNOSIS — R221 Localized swelling, mass and lump, neck: Secondary | ICD-10-CM | POA: Diagnosis not present

## 2015-06-10 DIAGNOSIS — F4320 Adjustment disorder, unspecified: Secondary | ICD-10-CM | POA: Diagnosis not present

## 2015-06-16 DIAGNOSIS — L720 Epidermal cyst: Secondary | ICD-10-CM | POA: Insufficient documentation

## 2015-06-16 DIAGNOSIS — F4320 Adjustment disorder, unspecified: Secondary | ICD-10-CM | POA: Diagnosis not present

## 2015-06-24 DIAGNOSIS — F4320 Adjustment disorder, unspecified: Secondary | ICD-10-CM | POA: Diagnosis not present

## 2015-07-14 DIAGNOSIS — F4320 Adjustment disorder, unspecified: Secondary | ICD-10-CM | POA: Diagnosis not present

## 2015-07-21 DIAGNOSIS — F330 Major depressive disorder, recurrent, mild: Secondary | ICD-10-CM | POA: Diagnosis not present

## 2015-07-28 DIAGNOSIS — F330 Major depressive disorder, recurrent, mild: Secondary | ICD-10-CM | POA: Diagnosis not present

## 2015-08-04 DIAGNOSIS — F330 Major depressive disorder, recurrent, mild: Secondary | ICD-10-CM | POA: Diagnosis not present

## 2015-08-11 DIAGNOSIS — F331 Major depressive disorder, recurrent, moderate: Secondary | ICD-10-CM | POA: Diagnosis not present

## 2015-08-24 DIAGNOSIS — M25561 Pain in right knee: Secondary | ICD-10-CM | POA: Diagnosis not present

## 2015-08-25 DIAGNOSIS — F330 Major depressive disorder, recurrent, mild: Secondary | ICD-10-CM | POA: Diagnosis not present

## 2015-09-01 DIAGNOSIS — F331 Major depressive disorder, recurrent, moderate: Secondary | ICD-10-CM | POA: Diagnosis not present

## 2015-09-08 DIAGNOSIS — F331 Major depressive disorder, recurrent, moderate: Secondary | ICD-10-CM | POA: Diagnosis not present

## 2015-09-14 DIAGNOSIS — M25561 Pain in right knee: Secondary | ICD-10-CM | POA: Diagnosis not present

## 2015-09-15 DIAGNOSIS — F330 Major depressive disorder, recurrent, mild: Secondary | ICD-10-CM | POA: Diagnosis not present

## 2015-09-22 DIAGNOSIS — F330 Major depressive disorder, recurrent, mild: Secondary | ICD-10-CM | POA: Diagnosis not present

## 2015-09-29 DIAGNOSIS — F330 Major depressive disorder, recurrent, mild: Secondary | ICD-10-CM | POA: Diagnosis not present

## 2015-10-06 DIAGNOSIS — F331 Major depressive disorder, recurrent, moderate: Secondary | ICD-10-CM | POA: Diagnosis not present

## 2015-10-20 DIAGNOSIS — F331 Major depressive disorder, recurrent, moderate: Secondary | ICD-10-CM | POA: Diagnosis not present

## 2015-10-27 DIAGNOSIS — F331 Major depressive disorder, recurrent, moderate: Secondary | ICD-10-CM | POA: Diagnosis not present

## 2015-11-03 DIAGNOSIS — F330 Major depressive disorder, recurrent, mild: Secondary | ICD-10-CM | POA: Diagnosis not present

## 2015-11-10 DIAGNOSIS — F332 Major depressive disorder, recurrent severe without psychotic features: Secondary | ICD-10-CM | POA: Diagnosis not present

## 2015-11-17 DIAGNOSIS — F331 Major depressive disorder, recurrent, moderate: Secondary | ICD-10-CM | POA: Diagnosis not present

## 2015-11-18 DIAGNOSIS — F331 Major depressive disorder, recurrent, moderate: Secondary | ICD-10-CM | POA: Diagnosis not present

## 2015-11-24 DIAGNOSIS — F331 Major depressive disorder, recurrent, moderate: Secondary | ICD-10-CM | POA: Diagnosis not present

## 2015-12-01 DIAGNOSIS — F321 Major depressive disorder, single episode, moderate: Secondary | ICD-10-CM | POA: Diagnosis not present

## 2015-12-01 DIAGNOSIS — F332 Major depressive disorder, recurrent severe without psychotic features: Secondary | ICD-10-CM | POA: Diagnosis not present

## 2015-12-01 DIAGNOSIS — F430 Acute stress reaction: Secondary | ICD-10-CM | POA: Diagnosis not present

## 2015-12-01 DIAGNOSIS — Z7289 Other problems related to lifestyle: Secondary | ICD-10-CM | POA: Diagnosis not present

## 2015-12-01 DIAGNOSIS — F4310 Post-traumatic stress disorder, unspecified: Secondary | ICD-10-CM | POA: Diagnosis not present

## 2015-12-03 ENCOUNTER — Emergency Department (HOSPITAL_COMMUNITY)
Admission: EM | Admit: 2015-12-03 | Discharge: 2015-12-04 | Disposition: A | Discharge status: Home / Self Care | Payer: BLUE CROSS/BLUE SHIELD | Attending: Emergency Medicine | Admitting: Emergency Medicine

## 2015-12-03 ENCOUNTER — Encounter (HOSPITAL_COMMUNITY): Payer: Self-pay

## 2015-12-03 DIAGNOSIS — L03317 Cellulitis of buttock: Secondary | ICD-10-CM | POA: Diagnosis not present

## 2015-12-03 DIAGNOSIS — L0231 Cutaneous abscess of buttock: Secondary | ICD-10-CM | POA: Diagnosis not present

## 2015-12-03 DIAGNOSIS — Z87891 Personal history of nicotine dependence: Secondary | ICD-10-CM | POA: Diagnosis not present

## 2015-12-03 NOTE — ED Triage Notes (Signed)
Pt states that she has a large abscess on her L buttock. Pt has a hx of same, but states that this is the largest/ most painful one shes ever had. Pt tried naproxen, warm baths, and warm compresses at home. A&Ox4. Ambulatory.

## 2015-12-04 MED ORDER — DOXYCYCLINE HYCLATE 100 MG PO CAPS: 100.0000 mg | ORAL_CAPSULE | Freq: Two times a day (BID) | ORAL | 0 refills | Status: DC

## 2015-12-04 MED ORDER — LIDOCAINE-EPINEPHRINE (PF) 1 %-1:200000 IJ SOLN: 20.0000 mL | Freq: Once | INTRAMUSCULAR | Status: DC

## 2015-12-04 MED ORDER — LIDOCAINE-EPINEPHRINE (PF) 1 %-1:200000 IJ SOLN
30.0000 mL | Freq: Once | INTRAMUSCULAR | Status: AC
  Administered 2015-12-04: 30 mL via INTRADERMAL
  Filled 2015-12-04: qty 30

## 2015-12-04 NOTE — ED Provider Notes (Signed)
[facility] DEPT Provider Note   CSN: [id number] Arrival date & time: 12/03/15  2204  By signing my name below, I, Dora Sims, attest that this documentation has been prepared under the direction and in the presence of Janetta Hora, PA-C. Electronically Signed: Dora Sims, Scribe. 12/04/2015. 12:41 AM.  History   Chief Complaint Chief Complaint  Patient presents with  . Abscess    The history is provided by the patient. No language interpreter was used.     HPI Comments: Sydney Beck is a 40 y.o. female who presents to the Emergency Department complaining of large left buttock abscess for the last 4-5 days. She states she abscess is very painful. She notes a h/o abscesses and states she has experienced abscesses to her buttocks for the last year. Pt states she has never had to have an abscess lanced and they have all drained on their own. Pt has tried warm compresses and warm baths at home with no improvement to the abscess; she notes it has not drained at all. Pt notes a subjective fever yesterday but took naproxen with total relief. She denies chills, numbness, weakness, or any other associated symptoms.  History reviewed. No pertinent past medical history.  Patient Active Problem List   Diagnosis Date Noted  . Submental mass 09/10/2014    History reviewed. No pertinent surgical history.  OB History    No data available       Home Medications    Prior to Admission medications   Medication Sig Start Date End Date Taking? Authorizing Provider  meloxicam (MOBIC) 15 MG tablet Take 1 tablet by mouth daily. 11/21/15  Yes Historical Provider, MD  naproxen (NAPROSYN) 250 MG tablet Take 250 mg by mouth 2 (two) times daily.   Yes Historical Provider, MD  metoCLOPramide (REGLAN) 10 MG tablet Take 1 tablet (10 mg total) by mouth every 6 (six) hours as needed for nausea (nausea/headache). Patient not taking: Reported on 12/04/2015 06/25/13   Drenda Freeze, MD     Family History Family History  Problem Relation Age of Onset  . Diabetes Father     Social History Social History  Substance Use Topics  . Smoking status: Former Research scientist (life sciences)  . Smokeless tobacco: Never Used  . Alcohol use Yes     Allergies   Bee venom; Oxycodone hcl; and Sulfa antibiotics   Review of Systems Review of Systems  Constitutional: Positive for fever (subjective, resolved). Negative for chills.  Skin: Positive for wound (left buttock abscess).  Neurological: Negative for weakness and numbness.  All other systems reviewed and are negative.   Physical Exam Updated Vital Signs BP 129/93 (BP Location: Left Arm)   Pulse 95   Temp 99.1 F (37.3 C) (Oral)   Resp 20   SpO2 100%   Physical Exam  Constitutional: She is oriented to person, place, and time. She appears well-developed and well-nourished. No distress.  Pleasant, NAD  HENT:  Head: Normocephalic and atraumatic.  Eyes: Conjunctivae and EOM are normal.  Neck: Neck supple. No tracheal deviation present.  Cardiovascular: Normal rate.   Pulmonary/Chest: Effort normal. No respiratory distress.  Musculoskeletal: Normal range of motion.  Neurological: She is alert and oriented to person, place, and time.  Skin: Skin is warm and dry.  6 x 3 cm erythematous, indurated area over left buttock without fluctuance or drainage but is tender  Psychiatric: She has a normal mood and affect. Her behavior is normal.  Nursing note and vitals reviewed.   ED  Treatments / Results  Labs (all labs ordered are listed, but only abnormal results are displayed) Labs Reviewed - No data to display  EKG  EKG Interpretation None       Radiology No results found.  Procedures Procedures (including critical care time)  INCISION AND DRAINAGE Performed by: Recardo Evangelist Consent: Verbal consent obtained. Risks and benefits: risks, benefits and alternatives were discussed Type: abscess  Body area: Left  buttock  Anesthesia: local infiltration  Incision was made with a scalpel.  Local anesthetic: lidocaine 1% with epinephrine  Anesthetic total: 8 ml  Complexity: simple  Blunt dissection to break up loculations  Drainage: bloody  Drainage amount: <1cc   Packing material: none  Patient tolerance: Patient tolerated the procedure well with no immediate complications.     DIAGNOSTIC STUDIES: Oxygen Saturation is 100% on RA, normal by my interpretation.    COORDINATION OF CARE: 12:47 AM Discussed treatment plan with pt at bedside and pt agreed to plan.  Medications Ordered in ED Medications - No data to display   Initial Impression / Assessment and Plan / ED Course  I have reviewed the triage vital signs and the nursing notes.  Pertinent labs & imaging results that were available during my care of the patient were reviewed by me and considered in my medical decision making (see chart for details).  Clinical Course   40 year old female presents with abscess with cellulitis. She is afebrile. Soft tissue US confirms small abscess. I&D performed which patient tolerated well but there was minimal to no drainage. Antibiotics rx given. Advised continue warm compresses and return for worsening symptoms. Patient is NAD, non-toxic, with stable VS. Patient is informed of clinical course, understands medical decision making process, and agrees with plan. Opportunity for questions provided and all questions answered. Return precautions given.  I personally performed the services described in this documentation, which was scribed in my presence. The recorded information has been reviewed and is accurate.   Final Clinical Impressions(s) / ED Diagnoses   Final diagnoses:  Cellulitis of buttock    New Prescriptions Discharge Medication List as of 12/04/2015  2:42 AM    START taking these medications   Details  doxycycline (VIBRAMYCIN) 100 MG capsule Take 1 capsule (100 mg total) by  mouth 2 (two) times daily., Starting Fri 12/04/2015, Print         Recardo Evangelist, PA-C 12/05/15 1349    Everlene Balls, MD 12/05/15 [phone]

## 2015-12-04 NOTE — Discharge Instructions (Signed)
Use warm compresses and take antibiotic Return for worsening symptoms

## 2015-12-08 DIAGNOSIS — F332 Major depressive disorder, recurrent severe without psychotic features: Secondary | ICD-10-CM | POA: Diagnosis not present

## 2015-12-09 DIAGNOSIS — F332 Major depressive disorder, recurrent severe without psychotic features: Secondary | ICD-10-CM | POA: Diagnosis not present

## 2015-12-13 DIAGNOSIS — F4312 Post-traumatic stress disorder, chronic: Secondary | ICD-10-CM | POA: Diagnosis not present

## 2015-12-13 DIAGNOSIS — F411 Generalized anxiety disorder: Secondary | ICD-10-CM | POA: Diagnosis not present

## 2015-12-13 DIAGNOSIS — F332 Major depressive disorder, recurrent severe without psychotic features: Secondary | ICD-10-CM | POA: Diagnosis not present

## 2015-12-15 DIAGNOSIS — F411 Generalized anxiety disorder: Secondary | ICD-10-CM | POA: Diagnosis not present

## 2015-12-15 DIAGNOSIS — F4312 Post-traumatic stress disorder, chronic: Secondary | ICD-10-CM | POA: Diagnosis not present

## 2015-12-15 DIAGNOSIS — F332 Major depressive disorder, recurrent severe without psychotic features: Secondary | ICD-10-CM | POA: Diagnosis not present

## 2015-12-19 DIAGNOSIS — F4310 Post-traumatic stress disorder, unspecified: Secondary | ICD-10-CM | POA: Diagnosis not present

## 2015-12-19 DIAGNOSIS — F332 Major depressive disorder, recurrent severe without psychotic features: Secondary | ICD-10-CM | POA: Diagnosis not present

## 2015-12-22 DIAGNOSIS — F321 Major depressive disorder, single episode, moderate: Secondary | ICD-10-CM | POA: Diagnosis not present

## 2015-12-22 DIAGNOSIS — F4310 Post-traumatic stress disorder, unspecified: Secondary | ICD-10-CM | POA: Diagnosis not present

## 2015-12-22 DIAGNOSIS — F332 Major depressive disorder, recurrent severe without psychotic features: Secondary | ICD-10-CM | POA: Diagnosis not present

## 2015-12-22 DIAGNOSIS — Z0271 Encounter for disability determination: Secondary | ICD-10-CM | POA: Diagnosis not present

## 2015-12-27 DIAGNOSIS — F332 Major depressive disorder, recurrent severe without psychotic features: Secondary | ICD-10-CM | POA: Diagnosis not present

## 2015-12-27 DIAGNOSIS — F4310 Post-traumatic stress disorder, unspecified: Secondary | ICD-10-CM | POA: Diagnosis not present

## 2015-12-29 DIAGNOSIS — F4310 Post-traumatic stress disorder, unspecified: Secondary | ICD-10-CM | POA: Diagnosis not present

## 2015-12-29 DIAGNOSIS — F332 Major depressive disorder, recurrent severe without psychotic features: Secondary | ICD-10-CM | POA: Diagnosis not present

## 2016-01-03 DIAGNOSIS — F332 Major depressive disorder, recurrent severe without psychotic features: Secondary | ICD-10-CM | POA: Diagnosis not present

## 2016-01-03 DIAGNOSIS — F4310 Post-traumatic stress disorder, unspecified: Secondary | ICD-10-CM | POA: Diagnosis not present

## 2016-01-04 DIAGNOSIS — F4310 Post-traumatic stress disorder, unspecified: Secondary | ICD-10-CM | POA: Diagnosis not present

## 2016-01-04 DIAGNOSIS — F332 Major depressive disorder, recurrent severe without psychotic features: Secondary | ICD-10-CM | POA: Diagnosis not present

## 2016-01-06 ENCOUNTER — Encounter (HOSPITAL_COMMUNITY): Payer: Self-pay

## 2016-01-10 DIAGNOSIS — F332 Major depressive disorder, recurrent severe without psychotic features: Secondary | ICD-10-CM | POA: Diagnosis not present

## 2016-01-12 DIAGNOSIS — F332 Major depressive disorder, recurrent severe without psychotic features: Secondary | ICD-10-CM | POA: Diagnosis not present

## 2016-01-17 DIAGNOSIS — F4310 Post-traumatic stress disorder, unspecified: Secondary | ICD-10-CM | POA: Diagnosis not present

## 2016-01-17 DIAGNOSIS — F332 Major depressive disorder, recurrent severe without psychotic features: Secondary | ICD-10-CM | POA: Diagnosis not present

## 2016-01-19 DIAGNOSIS — F4310 Post-traumatic stress disorder, unspecified: Secondary | ICD-10-CM | POA: Diagnosis not present

## 2016-01-19 DIAGNOSIS — F332 Major depressive disorder, recurrent severe without psychotic features: Secondary | ICD-10-CM | POA: Diagnosis not present

## 2016-01-20 ENCOUNTER — Ambulatory Visit: Payer: BLUE CROSS/BLUE SHIELD | Admitting: Psychiatry

## 2016-01-20 ENCOUNTER — Encounter: Payer: Self-pay | Admitting: Psychiatry

## 2016-01-20 VITALS — BP 126/87 | HR 75 | Temp 98.5°F | Wt 277.4 lb

## 2016-01-20 DIAGNOSIS — L309 Dermatitis, unspecified: Secondary | ICD-10-CM | POA: Insufficient documentation

## 2016-01-20 NOTE — Progress Notes (Signed)
Psychiatric Initial Adult Assessment   Patient Identification: Sydney Beck MRN:  [id number] Date of Evaluation:  01/20/2016  Patient presented for initial assessment. However she reported that she is currently on FMLA and on short-term disability. She does not want to participate in the interview and reported that she wants a refund for her co-pay as she is already placed on the disability by her counselor. She did not realize that she is coming for a psychiatric interview and she will not be able to get any documentation for the same due to our office policy. She reported that she will call back if she needs psychiatric services.      ROS                                                            Rainey Pines, MD 12/6/20179:19 AM

## 2016-01-24 DIAGNOSIS — F332 Major depressive disorder, recurrent severe without psychotic features: Secondary | ICD-10-CM | POA: Diagnosis not present

## 2016-01-24 DIAGNOSIS — F4310 Post-traumatic stress disorder, unspecified: Secondary | ICD-10-CM | POA: Diagnosis not present

## 2016-01-26 DIAGNOSIS — F4310 Post-traumatic stress disorder, unspecified: Secondary | ICD-10-CM | POA: Diagnosis not present

## 2016-01-26 DIAGNOSIS — F332 Major depressive disorder, recurrent severe without psychotic features: Secondary | ICD-10-CM | POA: Diagnosis not present

## 2016-02-10 DIAGNOSIS — G43909 Migraine, unspecified, not intractable, without status migrainosus: Secondary | ICD-10-CM | POA: Diagnosis not present

## 2016-02-10 DIAGNOSIS — F321 Major depressive disorder, single episode, moderate: Secondary | ICD-10-CM | POA: Diagnosis not present

## 2016-02-16 DIAGNOSIS — F4310 Post-traumatic stress disorder, unspecified: Secondary | ICD-10-CM | POA: Diagnosis not present

## 2016-02-16 DIAGNOSIS — F331 Major depressive disorder, recurrent, moderate: Secondary | ICD-10-CM | POA: Diagnosis not present

## 2016-02-21 DIAGNOSIS — F331 Major depressive disorder, recurrent, moderate: Secondary | ICD-10-CM | POA: Diagnosis not present

## 2016-02-21 DIAGNOSIS — F4310 Post-traumatic stress disorder, unspecified: Secondary | ICD-10-CM | POA: Diagnosis not present

## 2016-02-23 DIAGNOSIS — F331 Major depressive disorder, recurrent, moderate: Secondary | ICD-10-CM | POA: Diagnosis not present

## 2016-02-23 DIAGNOSIS — F4310 Post-traumatic stress disorder, unspecified: Secondary | ICD-10-CM | POA: Diagnosis not present

## 2016-02-27 DIAGNOSIS — M9901 Segmental and somatic dysfunction of cervical region: Secondary | ICD-10-CM | POA: Diagnosis not present

## 2016-02-27 DIAGNOSIS — M9902 Segmental and somatic dysfunction of thoracic region: Secondary | ICD-10-CM | POA: Diagnosis not present

## 2016-02-27 DIAGNOSIS — M9903 Segmental and somatic dysfunction of lumbar region: Secondary | ICD-10-CM | POA: Diagnosis not present

## 2016-02-27 DIAGNOSIS — M791 Myalgia: Secondary | ICD-10-CM | POA: Diagnosis not present

## 2016-02-27 DIAGNOSIS — M62830 Muscle spasm of back: Secondary | ICD-10-CM | POA: Diagnosis not present

## 2016-02-27 DIAGNOSIS — M9905 Segmental and somatic dysfunction of pelvic region: Secondary | ICD-10-CM | POA: Diagnosis not present

## 2016-02-27 DIAGNOSIS — R51 Headache: Secondary | ICD-10-CM | POA: Diagnosis not present

## 2016-02-29 DIAGNOSIS — F331 Major depressive disorder, recurrent, moderate: Secondary | ICD-10-CM | POA: Diagnosis not present

## 2016-03-06 DIAGNOSIS — F331 Major depressive disorder, recurrent, moderate: Secondary | ICD-10-CM | POA: Diagnosis not present

## 2016-03-09 ENCOUNTER — Encounter: Payer: Self-pay | Admitting: Family Medicine

## 2016-03-09 ENCOUNTER — Ambulatory Visit (INDEPENDENT_AMBULATORY_CARE_PROVIDER_SITE_OTHER): Payer: BLUE CROSS/BLUE SHIELD | Admitting: Family Medicine

## 2016-03-09 VITALS — BP 106/84 | HR 83 | Temp 97.9°F | Resp 18 | Ht 69.0 in | Wt 280.0 lb

## 2016-03-09 DIAGNOSIS — Z7689 Persons encountering health services in other specified circumstances: Secondary | ICD-10-CM

## 2016-03-09 DIAGNOSIS — Z0000 Encounter for general adult medical examination without abnormal findings: Secondary | ICD-10-CM

## 2016-03-09 DIAGNOSIS — F321 Major depressive disorder, single episode, moderate: Secondary | ICD-10-CM | POA: Insufficient documentation

## 2016-03-09 DIAGNOSIS — Z113 Encounter for screening for infections with a predominantly sexual mode of transmission: Secondary | ICD-10-CM | POA: Diagnosis not present

## 2016-03-09 DIAGNOSIS — M533 Sacrococcygeal disorders, not elsewhere classified: Secondary | ICD-10-CM | POA: Diagnosis not present

## 2016-03-09 DIAGNOSIS — L0293 Carbuncle, unspecified: Secondary | ICD-10-CM | POA: Diagnosis not present

## 2016-03-09 DIAGNOSIS — J309 Allergic rhinitis, unspecified: Secondary | ICD-10-CM | POA: Insufficient documentation

## 2016-03-09 DIAGNOSIS — F430 Acute stress reaction: Secondary | ICD-10-CM | POA: Insufficient documentation

## 2016-03-09 NOTE — Patient Instructions (Addendum)
IF you received an x-ray today, you will receive an invoice from [facility]. Please contact [facility] at [phone] with questions or concerns regarding your invoice.   IF you received labwork today, you will receive an invoice from [facility]. Please contact [facility] at [phone] with questions or concerns regarding your invoice.   Our billing staff will not be able to assist you with questions regarding bills from these companies.  You will be contacted with the lab results as soon as they are available. The fastest way to get your results is to activate your My Chart account. Instructions are located on the last page of this paperwork. If you have not heard from [place] regarding the results in 2 weeks, please contact this office.     We recommend that you schedule a mammogram for breast cancer screening. Typically, you do not need a referral to do this. Please contact a local imaging center to schedule your mammogram.  [facility] - [phone]  *ask for the Radiology Department [facility] ([place]) - [phone] or [phone]  [facility] - [phone] [place] [phone] [facility] - [phone]  *ask for the [facility] - [phone]  *ask for the Radiology Department [facility] - [phone]  *ask for the [facility] - [phone]  Sacroiliac Joint Dysfunction Introduction Sacroiliac joint dysfunction is a condition that causes inflammation on one or both sides of the sacroiliac (SI) joint. The SI joint connects the lower part of the spine (sacrum) with the two upper portions of the pelvis (ilium). This condition causes deep aching or burning pain in the low back. In some cases, the pain may also spread into one or both buttocks or hips or spread down the legs. What are the causes? This  condition may be caused by:  Pregnancy. During pregnancy, extra stress is put on the SI joints because the pelvis widens.  Injury, such as:  Car accidents.  Sport-related injuries.  Work-related injuries.  Having one leg that is shorter than the other.  Conditions that affect the joints, such as:  Rheumatoid arthritis.  Gout.  Psoriatic arthritis.  Joint infection (septic arthritis). Sometimes, the cause of SI joint dysfunction is not known. What are the signs or symptoms? Symptoms of this condition include:  Aching or burning pain in the lower back. The pain may also spread to other areas, such as:  Buttocks.  Groin.  Thighs and legs.  Muscle spasms in or around the painful areas.  Increased pain when standing, walking, running, stair climbing, bending, or lifting. How is this diagnosed? Your health care provider will do a physical exam and take your medical history. During the exam, the health care provider may move one or both of your legs to different positions to check for pain. Various tests may be done to help verify the diagnosis, including:  Imaging tests to look for other causes of pain. These may include:  MRI.  CT scan.  Bone scan.  Diagnostic injection. A numbing medicine is injected into the SI joint using a needle. If the pain is temporarily improved or stopped after the injection, this can indicate that SI joint dysfunction is the problem. How is this treated? Treatment may vary depending on the cause and severity of your condition. Treatment options may include:  Applying ice or heat to the lower back area. This can help  to reduce pain and muscle spasms.  Medicines to relieve pain or inflammation or to relax the muscles.  Wearing a back brace (sacroiliac brace) to help support the joint while your back is healing.  Physical therapy to increase muscle strength around the joint and flexibility at the joint. This may also involve learning  proper body positions and ways of moving to relieve stress on the joint.  Direct manipulation of the SI joint.  Injections of steroid medicine into the joint in order to reduce pain and swelling.  Radiofrequency ablation to burn away nerves that are carrying pain messages from the joint.  Use of a device that provides electrical stimulation in order to reduce pain at the joint.  Surgery to put in screws and plates that limit or prevent joint motion. This is rare. Follow these instructions at home:  Rest as needed. Limit your activities as directed by your health care provider.  Take medicines only as directed by your health care provider.  If directed, apply ice to the affected area:  Put ice in a plastic bag.  Place a towel between your skin and the bag.  Leave the ice on for 20 minutes, 2-3 times per day.  Use a heating pad or a moist heat pack as directed by your health care provider.  Exercise as directed by your health care provider or physical therapist.  Keep all follow-up visits as directed by your health care provider. This is important. Contact a health care provider if:  Your pain is not controlled with medicine.  You have a fever.  You have increasingly severe pain. Get help right away if:  You have weakness, numbness, or tingling in your legs or feet.  You lose control of your bladder or bowel. This information is not intended to replace advice given to you by your health care provider. Make sure you discuss any questions you have with your health care provider. Document Released: 04/29/2008 Document Revised: 07/09/2015 Document Reviewed: 10/08/2013  2017 [employer]

## 2016-03-09 NOTE — Progress Notes (Signed)
Chief Complaint  Patient presents with  . Annual Exam    Patient stated that she is having some hip pain    Subjective:  Sydney Beck is a 41 y.o. female here for a health maintenance visit.  Patient is new pt here to establish care and get a physical exam  Hip Pain -  She reports that on the left side back hip and seems to improve with advil  She reports that she is unsure if this is from knee surgery and was weight bearing differently due to her knee pain.  The pain has radiated down the leg before.  She also gets some pain in the hip and in the crease of the groin.   Recurring Boils She returned to work after leave of absence for stress and anxiety with hives and recurring boils She reports that she was concerned that it might have been a pilonidal cysts that resolved with warm compress and epsom salt soaks and doxycycline and it resolved on it owns.  She reports that she has had to get it lanced at the hospital in the past as well.   Tobacco Use She smokes about 8 cigarettes a day  She has been smoking for approximately 10 years She was smoking cigars (black and Milds) She states that she is interested in quitting She has been able to quit for a couple of months  She states that cigarettes are causing her to have acne   Patient Active Problem List   Diagnosis Date Noted  . Acute stress disorder 03/09/2016  . Allergic rhinitis 03/09/2016  . Moderate major depression, single episode ([place]) 03/09/2016  . Eczema of hand 01/20/2016    Past Medical History:  Diagnosis Date  . Anxiety   . Depression     Past Surgical History:  Procedure Laterality Date  . CYST REMOVAL NECK    . KNEE SURGERY Right      Outpatient Medications Prior to Visit  Medication Sig Dispense Refill  . meloxicam (MOBIC) 15 MG tablet Take 1 tablet by mouth daily.     No facility-administered medications prior to visit.     Allergies  Allergen Reactions  . Bee Venom Swelling    Other  reaction(s): itching /swelling  . Oxycodone Hcl Itching    Other reaction(s): itching  . Sulfa Antibiotics     Other reaction(s): itching Itching, hives     Family History  Problem Relation Age of Onset  . Diabetes Father   . Depression Father   . Anxiety disorder Father   . Dementia Father   . Anxiety disorder Mother   . Depression Mother   . Schizophrenia Brother   . Alcohol abuse Brother   . Drug abuse Brother   . Depression Brother   . Anxiety disorder Brother      Health Habits: Dental Exam: up to date Eye Exam: up to date Exercise:  times/week on average Current exercise activities: walking/running   Social History   Social History  . Marital status: Single    Spouse name: N/A  . Number of children: N/A  . Years of education: N/A   Occupational History  . Not on file.   Social History Main Topics  . Smoking status: Light Tobacco Smoker    Types: Cigarettes  . Smokeless tobacco: Never Used     Comment: socical   . Alcohol use 12.0 oz/week    15 Glasses of wine, 5 Shots of liquor per week  . Drug use:  Yes    Types: Marijuana     Comment: used on a weekly basis  . Sexual activity: Yes   Other Topics Concern  . Not on file   Social History Narrative  . No narrative on file   History  Alcohol Use  . 12.0 oz/week  . 15 Glasses of wine, 5 Shots of liquor per week   History  Smoking Status  . Light Tobacco Smoker  . Types: Cigarettes  Smokeless Tobacco  . Never Used    Comment: socical    History  Drug Use  . Types: Marijuana    Comment: used on a weekly basis    GYN: Sexual Health Menstrual status: regular menses LMP: Patient's last menstrual period was 02/24/2016 (approximate). Last pap smear: see HM section History of abnormal pap smears:  Sexually active: ** with ** partner Current contraception:   Health Maintenance: See under health Maintenance activity for review of completion dates as well.  There is no immunization  history on file for this patient.    Depression Screen-PHQ2/9 Depression screen [facility] 2/9 03/09/2016  Decreased Interest 0  Down, Depressed, Hopeless 0  PHQ - 2 Score 0      Depression Severity and Treatment Recommendations:  0-4= None  5-9= Mild / Treatment: Support, educate to call if worse; return in one month  10-14= Moderate / Treatment: Support, watchful waiting; Antidepressant or Psycotherapy  15-19= Moderately severe / Treatment: Antidepressant OR Psychotherapy  >= 20 = Major depression, severe / Antidepressant AND Psychotherapy    Review of Systems   Review of Systems  Constitutional: Negative for chills, fever, malaise/fatigue and weight loss.  HENT: Negative for hearing loss and tinnitus.   Eyes: Negative for blurred vision, double vision and photophobia.  Respiratory: Negative for cough, shortness of breath and wheezing.   Cardiovascular: Negative for chest pain and palpitations.  Gastrointestinal: Negative for abdominal pain, nausea and vomiting.  Genitourinary: Negative for dysuria, frequency and urgency.  Musculoskeletal: Positive for back pain and joint pain.  Skin: Negative for itching and rash.  Neurological: Negative for dizziness and headaches.  Psychiatric/Behavioral: Negative for depression. The patient is not nervous/anxious.     See HPI for ROS as well.    Objective:   Vitals:   03/09/16 1539  BP: 106/84  Pulse: 83  Resp: 18  Temp: 97.9 F (36.6 C)  TempSrc: Oral  SpO2: 99%  Weight: 280 lb (127 kg)  Height: 5\' 9"  (1.753 m)    Body mass index is 41.35 kg/m.   Physical Exam  Constitutional: She is oriented to person, place, and time. She appears well-developed and well-nourished.  HENT:  Head: Normocephalic and atraumatic.  Right Ear: External ear normal.  Left Ear: External ear normal.  Eyes: Conjunctivae and EOM are normal.  Neck: Normal range of motion. Neck supple. No thyromegaly present.  Cardiovascular: Normal rate, regular  rhythm and normal heart sounds.   Pulmonary/Chest: Effort normal and breath sounds normal. No respiratory distress. She has no wheezes.  Abdominal: Soft. Bowel sounds are normal. She exhibits no distension. There is no tenderness. There is no rebound.  Musculoskeletal:       Lumbar back: She exhibits tenderness. She exhibits normal range of motion, no bony tenderness, no swelling, no edema, no pain and no spasm.       Back:  Neurological: She is alert and oriented to person, place, and time. She has normal reflexes.  Psychiatric: She has a normal mood and affect. Her behavior  is normal. Judgment and thought content normal.       Assessment/Plan:   Patient was seen for a health maintenance exam.  Counseled the patient on health maintenance issues. Reviewed her health mainteance schedule and ordered appropriate tests (see orders.) Counseled on regular exercise and weight management. Recommend regular eye exams and dental cleaning.   The following issues were addressed today for health maintenance:   Kirin was seen today for annual exam.  Diagnoses and all orders for this visit:  Health maintenance examination- age appropriate screenings -     Basic metabolic panel -     Hemoglobin A1c -     Lipid panel -     CBC -     GC/Chlamydia Probe Amp  Encounter to establish care  Recurrent boils- currently stable  Will screen for diabetes  Morbid obesity ([place])- discussed diet and exercise. Advised pt to try weight watchers or similar program -     Hemoglobin A1c  Screen for STD (sexually transmitted disease)- performed urinary gc/ct -     GC/Chlamydia Probe Amp  SI (sacroiliac) joint dysfunction- discussed benefit of PT and otc nsaids -     Ambulatory referral to Physical Therapy    No Follow-up on file.    Body mass index is 41.35 kg/m.:  Discussed the patient's BMI with patient. The BMI body mass index is 41.35 kg/m.     Future Appointments Date Time Provider  [facility]  03/16/2016 8:00 AM Danie Binder, PT [facility]  05/11/2016 3:40 PM Ula Couvillon Elsie Lincoln, MD PCP-PCP [facility]    Patient Instructions       IF you received an x-ray today, you will receive an invoice from [facility]. Please contact [facility] at [phone] with questions or concerns regarding your invoice.   IF you received labwork today, you will receive an invoice from [facility]. Please contact [facility] at [phone] with questions or concerns regarding your invoice.   Our billing staff will not be able to assist you with questions regarding bills from these companies.  You will be contacted with the lab results as soon as they are available. The fastest way to get your results is to activate your My Chart account. Instructions are located on the last page of this paperwork. If you have not heard from [place] regarding the results in 2 weeks, please contact this office.     We recommend that you schedule a mammogram for breast cancer screening. Typically, you do not need a referral to do this. Please contact a local imaging center to schedule your mammogram.  [facility] - [phone]  *ask for the Radiology Department [facility] - [phone] or [phone]  [facility] - [phone] [phone] [facility] - [phone]  *ask for the [facility] - [phone]  *ask for the Radiology Department [facility] - [phone]  *ask for the [facility] - [phone]  Sacroiliac Joint Dysfunction Introduction Sacroiliac joint dysfunction is a condition that causes inflammation on one or both sides of the sacroiliac (SI) joint. The SI joint connects the lower part of the spine (sacrum) with the two upper portions of the pelvis (ilium). This condition causes deep aching or  burning pain in the low back. In some cases, the pain may also spread into one or both buttocks or hips or spread down the legs. What are  the causes? This condition may be caused by:  Pregnancy. During pregnancy, extra stress is put on the SI joints because the pelvis widens.  Injury, such as:  Car accidents.  Sport-related injuries.  Work-related injuries.  Having one leg that is shorter than the other.  Conditions that affect the joints, such as:  Rheumatoid arthritis.  Gout.  Psoriatic arthritis.  Joint infection (septic arthritis). Sometimes, the cause of SI joint dysfunction is not known. What are the signs or symptoms? Symptoms of this condition include:  Aching or burning pain in the lower back. The pain may also spread to other areas, such as:  Buttocks.  Groin.  Thighs and legs.  Muscle spasms in or around the painful areas.  Increased pain when standing, walking, running, stair climbing, bending, or lifting. How is this diagnosed? Your health care provider will do a physical exam and take your medical history. During the exam, the health care provider may move one or both of your legs to different positions to check for pain. Various tests may be done to help verify the diagnosis, including:  Imaging tests to look for other causes of pain. These may include:  MRI.  CT scan.  Bone scan.  Diagnostic injection. A numbing medicine is injected into the SI joint using a needle. If the pain is temporarily improved or stopped after the injection, this can indicate that SI joint dysfunction is the problem. How is this treated? Treatment may vary depending on the cause and severity of your condition. Treatment options may include:  Applying ice or heat to the lower back area. This can help to reduce pain and muscle spasms.  Medicines to relieve pain or inflammation or to relax the muscles.  Wearing a back brace (sacroiliac brace) to help support the joint  while your back is healing.  Physical therapy to increase muscle strength around the joint and flexibility at the joint. This may also involve learning proper body positions and ways of moving to relieve stress on the joint.  Direct manipulation of the SI joint.  Injections of steroid medicine into the joint in order to reduce pain and swelling.  Radiofrequency ablation to burn away nerves that are carrying pain messages from the joint.  Use of a device that provides electrical stimulation in order to reduce pain at the joint.  Surgery to put in screws and plates that limit or prevent joint motion. This is rare. Follow these instructions at home:  Rest as needed. Limit your activities as directed by your health care provider.  Take medicines only as directed by your health care provider.  If directed, apply ice to the affected area:  Put ice in a plastic bag.  Place a towel between your skin and the bag.  Leave the ice on for 20 minutes, 2-3 times per day.  Use a heating pad or a moist heat pack as directed by your health care provider.  Exercise as directed by your health care provider or physical therapist.  Keep all follow-up visits as directed by your health care provider. This is important. Contact a health care provider if:  Your pain is not controlled with medicine.  You have a fever.  You have increasingly severe pain. Get help right away if:  You have weakness, numbness, or tingling in your legs or feet.  You lose control of your bladder or bowel. This information is not intended to replace advice given to you by your health care provider. Make sure you  discuss any questions you have with your health care provider. Document Released: 04/29/2008 Document Revised: 07/09/2015 Document Reviewed: 10/08/2013  2017 Elsevier

## 2016-03-10 LAB — BASIC METABOLIC PANEL
BUN / CREAT RATIO: 10 (ref 9–23)
BUN: 10 mg/dL (ref 6–20)
CHLORIDE: 100 mmol/L (ref 96–106)
CO2: 23 mmol/L (ref 18–29)
Calcium: 9.2 mg/dL (ref 8.7–10.2)
Creatinine, Ser: 0.97 mg/dL (ref 0.57–1.00)
GFR calc Af Amer: 85 mL/min/{1.73_m2} (ref >59)
GFR calc non Af Amer: 74 mL/min/{1.73_m2} (ref >59)
GLUCOSE: 109 mg/dL — AB (ref 65–99)
Potassium: 4.3 mmol/L (ref 3.5–5.2)
Sodium: 138 mmol/L (ref 134–144)

## 2016-03-10 LAB — CBC
HEMOGLOBIN: 13.1 g/dL (ref 11.1–15.9)
Hematocrit: 39.7 % (ref 34.0–46.6)
MCH: 28.9 pg (ref 26.6–33.0)
MCHC: 33 g/dL (ref 31.5–35.7)
MCV: 87 fL (ref 79–97)
Platelets: 221 10*3/uL (ref 150–379)
RBC: 4.54 x10E6/uL (ref 3.77–5.28)
RDW: 15 % (ref 12.3–15.4)
WBC: 6.5 10*3/uL (ref 3.4–10.8)

## 2016-03-10 LAB — LIPID PANEL
CHOL/HDL RATIO: 2.7 ratio (ref 0.0–4.4)
Cholesterol, Total: 160 mg/dL (ref 100–199)
HDL: 59 mg/dL (ref >39)
LDL Calculated: 72 mg/dL (ref 0–99)
Triglycerides: 146 mg/dL (ref 0–149)
VLDL Cholesterol Cal: 29 mg/dL (ref 5–40)

## 2016-03-10 LAB — HEMOGLOBIN A1C
Est. average glucose Bld gHb Est-mCnc: 114 mg/dL
Hgb A1c MFr Bld: 5.6 % (ref 4.8–5.6)

## 2016-03-12 LAB — GC/CHLAMYDIA PROBE AMP
Chlamydia trachomatis, NAA: NEGATIVE
Neisseria gonorrhoeae by PCR: NEGATIVE

## 2016-03-15 DIAGNOSIS — F4310 Post-traumatic stress disorder, unspecified: Secondary | ICD-10-CM | POA: Diagnosis not present

## 2016-03-15 DIAGNOSIS — F331 Major depressive disorder, recurrent, moderate: Secondary | ICD-10-CM | POA: Diagnosis not present

## 2016-03-16 ENCOUNTER — Ambulatory Visit: Payer: BLUE CROSS/BLUE SHIELD | Attending: Family Medicine

## 2016-03-16 DIAGNOSIS — M25652 Stiffness of left hip, not elsewhere classified: Secondary | ICD-10-CM

## 2016-03-16 DIAGNOSIS — G8929 Other chronic pain: Secondary | ICD-10-CM | POA: Diagnosis not present

## 2016-03-16 DIAGNOSIS — M545 Low back pain, unspecified: Secondary | ICD-10-CM

## 2016-03-16 DIAGNOSIS — M25651 Stiffness of right hip, not elsewhere classified: Secondary | ICD-10-CM

## 2016-03-16 DIAGNOSIS — R252 Cramp and spasm: Secondary | ICD-10-CM | POA: Diagnosis not present

## 2016-03-16 NOTE — Therapy (Signed)
[facility] [address] [place], [place], [address] Phone: [phone]   Fax:  [phone]  Physical Therapy Evaluation  Patient Details  Name: Sydney Beck MRN: [id number] Date of Birth: 07-18-1975 Referring Provider: Delia Chimes, MD  Encounter Date: 03/16/2016      PT End of Session - 03/16/16 0842    Visit Number 1   Date for PT Re-Evaluation 05/11/16   PT Start Time 0810   PT Stop Time 0843   PT Time Calculation (min) 33 min   Activity Tolerance Patient tolerated treatment well   Behavior During Therapy [facility] for tasks assessed/performed      Past Medical History:  Diagnosis Date  . Anxiety   . Depression     Past Surgical History:  Procedure Laterality Date  . CYST REMOVAL NECK    . KNEE SURGERY Right     There were no vitals filed for this visit.       Subjective Assessment - 03/16/16 0809    Subjective Pt reports to PT with complatins of Lt sided LBP and SI joint pain of a chronic nature.  Pt reports that she had Rt knee surgery 3 years ago after years of Rt knee pain.  Pt reports that she was shifting a lot of weight to the Lt side during that period of time.     Diagnostic tests none   Patient Stated Goals reduce pain, improve symmetry with sitting and standing, return to exercise   Currently in Pain? Yes   Pain Score 2   end of the day 5/10   Pain Location Back   Pain Orientation Left   Pain Descriptors / Indicators Burning;Aching;Dull   Pain Type Chronic pain   Pain Onset More than a month ago   Pain Frequency Constant   Aggravating Factors  end of the day, sitting with symmetry, standing with symmetry, palpation to the area   Pain Relieving Factors medication            [facility] PT Assessment - 03/16/16 0001      Assessment   Medical Diagnosis SI joing dysfunction   Referring Provider Delia Chimes, MD   Onset Date/Surgical Date 03/16/12   Next MD Visit none   Prior  Therapy none     Precautions   Precautions None     Restrictions   Weight Bearing Restrictions No     Balance Screen   Has the patient fallen in the past 6 months No   Has the patient had a decrease in activity level because of a fear of falling?  No   Is the patient reluctant to leave their home because of a fear of falling?  No     Home Environment   Living Environment Private residence   Type of [place] to enter   Home Layout One level   [place] None     Prior Function   Level of Independence Independent   Vocation Full time employment   Vocation Requirements desk work   Leisure walking, crafts     Cognition   Overall Cognitive Status Within Functional Limits for tasks assessed     Observation/Other Assessments   Focus on Therapeutic Outcomes (FOTO)  33% limitation     Posture/Postural Control   Posture/Postural Control Postural limitations   Postural Limitations Forward head;Increased lumbar lordosis   Posture Comments pt sits and stands with Rt>Lt weightbearing     ROM /  Strength   AROM / PROM / Strength AROM;PROM;Strength     AROM   Overall AROM  Deficits   Overall AROM Comments Lumbar A/ROM limited by 25% into bil. sidebending.  All other A/ROM is WFLs.  Pt reports Lt sided pain with flexion and Lt sidebending     PROM   Overall PROM  Deficits   Overall PROM Comments Bil hip flexibility limited by 25% in all directions.  Pt reports Lt SI joint pain with end range Lt knee pain.       Strength   Overall Strength Within functional limits for tasks performed     Palpation   SI assessment  pain over Lt SI joint with palpation   Palpation comment Pt with palpable tenderness over bil. lumbar paraspinals, Lt SI joint, Lt deep gluteals and glut med.      Special Tests    Special Tests Lumbar   Lumbar Tests Slump Test     Slump test   Findings Negative   Side Left     Ambulation/Gait   Ambulation/Gait Yes   Gait Pattern  Within Functional Limits                           PT Education - 03/16/16 0836    Education provided Yes   Education Details HEP for hip flexibility   Person(s) Educated Patient   Methods Explanation;Demonstration;Handout   Comprehension Verbalized understanding;Returned demonstration          PT Short Term Goals - 03/16/16 0849      PT SHORT TERM GOAL #1   Title be independent in initial HEP   Time 4   Period Weeks   Status New     PT SHORT TERM GOAL #2   Title sit with Rt=Lt weightbearing > or = to 50% of the time at work   Time 4   Period Weeks   Status New     PT SHORT TERM GOAL #3   Title demonstrate and verbalize understanding of correct body mechanics for lumbar protection   Time 4   Period Weeks   Status New     PT SHORT TERM GOAL #4   Title report a 30% reduction in LBP/Lt SI joint pain with daily activity   Time 4   Period Weeks   Status New           PT Long Term Goals - 03/16/16 [id number]      PT LONG TERM GOAL #1   Title demonstrate independence in advanced HEP   Time 8   Period Weeks   Status New     PT LONG TERM GOAL #2   Title reduce FOTO to < or = to 30% limitation   Time 8   Period Weeks   Status New     PT LONG TERM GOAL #3   Title initiate an exercise routine including walking/recumbent bike and weights and verbalize how to safely progress   Time 8   Period Weeks   Status New     PT LONG TERM GOAL #4   Title report a 60% reduction in LBP/Lt SI joint pain with daily activity   Time 8   Period Weeks   Status New     PT LONG TERM GOAL #5   Title sit with Rt=Lt weightbearing > or = to 75% of the time at work   Time 8   Period Weeks  Plan - 03/16/16 0843    Clinical Impression Statement Pt presents to PT with Lt side LBP and SI joint pain of a chronic nature.  Pt reports that she had Rt knee pain for years and had surgery to the Rt knee 3 years ago.  She was shifting weight to the Lt side  during that time.  Pt reports 2/10 Lt SI joint pain today and up to 5/10 at the end of the day.  Pt demonstrates painful lumbar A/ROM, limited ROM in bil hips with pain and palpable tenderness/trigger points in bil. lumbar paraspinals and Lt buttock musculature.  Pt sits and stands with Rt>Lt weightbearing.  Pt is a low complexity evaluation due to lack of comorbidities and stable condition.  Pt will benefit from skilled PT for hip and lumbar flexibility, manual to Lt gluteals/SI joint, strength and modalities.     Rehab Potential Good   PT Frequency 2x / week   PT Duration 8 weeks   PT Treatment/Interventions ADLs/Self Care Home Management;Cryotherapy;Electrical Stimulation;Iontophoresis 4mg /ml Dexamethasone;Functional mobility training;Gait training;Stair training;Ultrasound;Moist Heat;Therapeutic activities;Therapeutic exercise;Neuromuscular re-education;Patient/family education;Passive range of motion;Manual techniques;Dry needling;Taping   PT Next Visit Plan Body mechanics education, manual and possible dry needling to Lt gluteals and lumbar spine, core strength, hip flexibility   Consulted and Agree with Plan of Care Patient      Patient will benefit from skilled therapeutic intervention in order to improve the following deficits and impairments:  Postural dysfunction, Impaired flexibility, Pain, Improper body mechanics, Decreased activity tolerance, Increased muscle spasms, Decreased range of motion  Visit Diagnosis: Chronic left-sided low back pain without sciatica - Plan: PT plan of care cert/re-cert  Stiffness of left hip, not elsewhere classified - Plan: PT plan of care cert/re-cert  Stiffness of right hip, not elsewhere classified - Plan: PT plan of care cert/re-cert  Cramp and spasm - Plan: PT plan of care cert/re-cert     Problem List Patient Active Problem List   Diagnosis Date Noted  . Acute stress disorder 03/09/2016  . Allergic rhinitis 03/09/2016  . Moderate major  depression, single episode ([place]) 03/09/2016  . Eczema of hand 01/20/2016    Sigurd Sos, PT 03/16/16 8:54 AM   [facility] [address] [place], [place], [address] Phone: [phone]   Fax:  [phone]  Name: Sydney Beck MRN: [id number] Date of Birth: Nov 23, 1975

## 2016-03-16 NOTE — Patient Instructions (Addendum)
Perform all exercises below:  Hold _20___ seconds. Repeat _3___ times.  Do __3__ sessions per day. CAUTION: Movement should be gentle, steady and slow.  Knee to Chest  Lying supine, bend involved knee to chest. Perform with each leg.    Lumbar Rotation: Caudal - Bilateral (Supine)  Feet and knees together, arms outstretched, rotate knees left, turning head in opposite direction, until stretch is felt.      HIP: Hamstrings - Short Sitting   Rest leg on raised surface. Keep knee straight. Lift chest.     Hip Stretch  Put right ankle over left knee. Let right knee fall downward, but keep ankle in place. Feel the stretch in hip. May push down gently with hand to feel stretch. Hold ____ seconds while counting out loud. Repeat with other leg. Repeat ____ times. Do ____ sessions per day.     Stretching: Piriformis (Supine)  Pull right knee toward opposite shoulder. Hold ____ seconds. Relax. Repeat ____ times per set. Do ____ sets per session. Do ____ sessions per day.      Sydney Beck [address], [place], [place] [address] Phone # [phone] Fax [phone]

## 2016-03-21 DIAGNOSIS — F331 Major depressive disorder, recurrent, moderate: Secondary | ICD-10-CM | POA: Diagnosis not present

## 2016-03-21 DIAGNOSIS — F4310 Post-traumatic stress disorder, unspecified: Secondary | ICD-10-CM | POA: Diagnosis not present

## 2016-03-29 ENCOUNTER — Ambulatory Visit: Payer: BLUE CROSS/BLUE SHIELD | Attending: Family Medicine | Admitting: Physical Therapy

## 2016-03-29 DIAGNOSIS — M545 Low back pain: Secondary | ICD-10-CM | POA: Insufficient documentation

## 2016-03-29 DIAGNOSIS — G8929 Other chronic pain: Secondary | ICD-10-CM

## 2016-03-29 DIAGNOSIS — R252 Cramp and spasm: Secondary | ICD-10-CM | POA: Insufficient documentation

## 2016-03-29 DIAGNOSIS — M25651 Stiffness of right hip, not elsewhere classified: Secondary | ICD-10-CM

## 2016-03-29 DIAGNOSIS — M25652 Stiffness of left hip, not elsewhere classified: Secondary | ICD-10-CM | POA: Insufficient documentation

## 2016-03-29 NOTE — Patient Instructions (Addendum)

## 2016-03-29 NOTE — Therapy (Signed)
[facility] [address]  [place], [place], [address] Phone: [phone]   Fax:  [phone]  Physical Therapy Treatment  Patient Details  Name: Sydney Beck MRN: [id number] Date of Birth: [birth year] Referring Provider: Delia Chimes, MD  Encounter Date: 03/29/2016      PT End of Session - 03/29/16 1631    Visit Number 2   Number of Visits 16   Date for PT Re-Evaluation 05/11/16   PT Start Time [id number]   PT Stop Time 1623   PT Time Calculation (min) 50 min   Activity Tolerance Patient tolerated treatment well   Behavior During Therapy [facility] for tasks assessed/performed      Past Medical History:  Diagnosis Date  . Anxiety   . Depression     Past Surgical History:  Procedure Laterality Date  . CYST REMOVAL NECK    . KNEE SURGERY Right     There were no vitals filed for this visit.      Subjective Assessment - 03/29/16 1535    Subjective Pt reports she has pain in her "butt". The pain has been coming and going. Pt states she has been doing the exercises at home.    Currently in Pain? Yes   Pain Score 2    Pain Location Back  "Butt"   Pain Orientation Left   Pain Descriptors / Indicators Burning;Aching;Dull   Pain Type Chronic pain   Pain Radiating Towards Pain goes down back of leg to about mid calf   Pain Onset More than a month ago   Pain Frequency Constant   Aggravating Factors  End of the day, sitting & standing,    Pain Relieving Factors medication                         [facility] Adult PT Treatment/Exercise - 03/29/16 1534      Lumbar Exercises: Stretches   Passive Hamstring Stretch 30 seconds;2 reps   Passive Hamstring Stretch Limitations L in supine with strap   Piriformis Stretch 2 reps;30 seconds   Piriformis Stretch Limitations L; fig 4 in sitting     Lumbar Exercises: Aerobic   Stationary Bike lvl 2 x 6'     Lumbar Exercises: Standing   Row Both;10 reps   Theraband Level (Row) Level 3 (Green)   Row Limitations 5" holds + ab set     Lumbar Exercises: Supine   Ab Set 10 reps   AB Set Limitations 10" holds; last 5 reps with pelvic tilt   Bridge 10 reps;5 seconds   Bridge Limitations + ab set   Other Supine Lumbar Exercises bridges with ball squeeze; 10 reps; 5" holds     Lumbar Exercises: Sidelying   Clam 10 reps;3 seconds   Clam Limitations red TB + ab set                PT Education - 03/29/16 1630    Education provided Yes   Education Details Initial strengthening HEP & posture/Body [employer]) Educated Patient   Methods Explanation;Demonstration;Handout   Comprehension Verbalized understanding;Returned demonstration          PT Short Term Goals - 03/29/16 1647      PT SHORT TERM GOAL #1   Title be independent in initial HEP by 04/15/16.   Time --   Period --   Status On-going     PT SHORT TERM GOAL #2  Title sit with Rt=Lt weightbearing > or = to 50% of the time at work by 04/15/16.   Time --   Period --   Status On-going     PT SHORT TERM GOAL #3   Title demonstrate and verbalize understanding of correct body mechanics for lumbar protection by 04/15/16.   Time --   Period --   Status On-going     PT SHORT TERM GOAL #4   Title report a 30% reduction in LBP/Lt SI joint pain with daily activity by 04/15/16.   Time --   Period --   Status On-going           PT Long Term Goals - 03/29/16 1649      PT LONG TERM GOAL #1   Title demonstrate independence in advanced HEP by 05/11/16.   Status On-going     PT LONG TERM GOAL #2   Title reduce FOTO to < or = to 30% limitation by 05/11/16.   Status On-going     PT LONG TERM GOAL #3   Title initiate an exercise routine including walking/recumbent bike and weights and verbalize how to safely progress by 05/11/16.   Status On-going     PT LONG TERM GOAL #4   Title report a 60% reduction in LBP/Lt SI joint pain with daily activity by 05/11/16.    Status On-going     PT LONG TERM GOAL #5   Title sit with Rt=Lt weightbearing > or = to 75% of the time at work by 05/11/16.   Status On-going               Plan - 03/29/16 1644    Clinical Impression Statement Pt reports to therapy stating she is continuing to have pain in her "butt" on the L side as well as having some L groin pain. She reports she has been able to perform her exercises but upon review had been skipping a few on accident. After reviewing the stretches today she reported that her pain appeared to have decreased. The patient started a new workout program at the gym that involves weights and twisting movements. Pt was advised to hold off on this program until LBP is under better control. She was given a note to excuse her from the program for a month. She was able to tolerate beginning core exercises well today with no increased pain. She was given an initial core/LE strengthening HEP & information on posture/body mechanics. We were a little limited with the activities we could perform due to pt clothing. Pt will continue to benefit from core/LE strengthening, LE stretching, & improving posture/body mechanics for job tasks and normal daily chores.    Rehab Potential Good   PT Treatment/Interventions ADLs/Self Care Home Management;Cryotherapy;Electrical Stimulation;Iontophoresis 4mg /ml Dexamethasone;Functional mobility training;Gait training;Stair training;Ultrasound;Moist Heat;Therapeutic activities;Therapeutic exercise;Neuromuscular re-education;Patient/family education;Passive range of motion;Manual techniques;Dry needling;Taping   PT Next Visit Plan Review HEP & posture/body mechanics handouts; progress core/LE strengthening; assess SI/pelvic alignment; Manual & possible dry needling to Lt gluteals & lumbar spine   Consulted and Agree with Plan of Care Patient      Patient will benefit from skilled therapeutic intervention in order to improve the following deficits and  impairments:  Postural dysfunction, Impaired flexibility, Pain, Improper body mechanics, Decreased activity tolerance, Increased muscle spasms, Decreased range of motion  Visit Diagnosis: Chronic left-sided low back pain without sciatica  Stiffness of left hip, not elsewhere classified  Stiffness of right hip, not elsewhere classified  Cramp and  spasm     Problem List Patient Active Problem List   Diagnosis Date Noted  . Acute stress disorder 03/09/2016  . Allergic rhinitis 03/09/2016  . Moderate major depression, single episode ([place]) 03/09/2016  . Eczema of hand 01/20/2016    Lauralee Evener, SPT 03/29/2016, 6:04 PM  [facility] [address]  [place], [place], [address] Phone: [phone]   Fax:  [phone]  Name: Chaisty Sagers MRN: [id number] Date of Birth: 04-Jun-1975

## 2016-03-31 DIAGNOSIS — F331 Major depressive disorder, recurrent, moderate: Secondary | ICD-10-CM | POA: Diagnosis not present

## 2016-03-31 DIAGNOSIS — F4310 Post-traumatic stress disorder, unspecified: Secondary | ICD-10-CM | POA: Diagnosis not present

## 2016-04-04 ENCOUNTER — Ambulatory Visit: Payer: BLUE CROSS/BLUE SHIELD

## 2016-04-07 ENCOUNTER — Ambulatory Visit: Payer: BLUE CROSS/BLUE SHIELD | Admitting: Physical Therapy

## 2016-04-07 DIAGNOSIS — G8929 Other chronic pain: Secondary | ICD-10-CM

## 2016-04-07 DIAGNOSIS — M545 Low back pain: Principal | ICD-10-CM

## 2016-04-07 DIAGNOSIS — M25651 Stiffness of right hip, not elsewhere classified: Secondary | ICD-10-CM | POA: Diagnosis not present

## 2016-04-07 DIAGNOSIS — F331 Major depressive disorder, recurrent, moderate: Secondary | ICD-10-CM | POA: Diagnosis not present

## 2016-04-07 DIAGNOSIS — M25652 Stiffness of left hip, not elsewhere classified: Secondary | ICD-10-CM

## 2016-04-07 DIAGNOSIS — R252 Cramp and spasm: Secondary | ICD-10-CM

## 2016-04-07 DIAGNOSIS — F4310 Post-traumatic stress disorder, unspecified: Secondary | ICD-10-CM | POA: Diagnosis not present

## 2016-04-07 NOTE — Therapy (Signed)
[facility] [address]  [place], [place], [address] Phone: [phone]   Fax:  [phone]  Physical Therapy Treatment  Patient Details  Name: Sydney Beck MRN: [id number] Date of Birth: March 30, 1975 Referring Provider: Delia Chimes, MD  Encounter Date: 04/07/2016      PT End of Session - 04/07/16 1547    Visit Number 3   Number of Visits 16   Date for PT Re-Evaluation 05/11/16   PT Start Time [id number]  pt arrived late   PT Stop Time 1618   PT Time Calculation (min) 35 min   Activity Tolerance Patient tolerated treatment well   Behavior During Therapy [facility] for tasks assessed/performed      Past Medical History:  Diagnosis Date  . Anxiety   . Depression     Past Surgical History:  Procedure Laterality Date  . CYST REMOVAL NECK    . KNEE SURGERY Right     There were no vitals filed for this visit.      Subjective Assessment - 04/07/16 1545    Subjective Pt reports her back pain has been up & down and today she is having pain in her buttocks and describes it as a "nodule"   Patient Stated Goals reduce pain, improve symmetry with sitting and standing, return to exercise   Currently in Pain? Yes   Pain Score 4    Pain Location Back   Pain Orientation Left;Lower   Pain Descriptors / Indicators Dull;Aching   Pain Type Chronic pain   Pain Onset More than a month ago   Pain Frequency Constant   Aggravating Factors  End of the day, sitting & standing   Pain Relieving Factors medication                         [facility] Adult PT Treatment/Exercise - 04/07/16 0001      Lumbar Exercises: Stretches   Passive Hamstring Stretch 30 seconds;1 rep   Passive Hamstring Stretch Limitations L in supine with strap   Piriformis Stretch 1 rep;30 seconds   Piriformis Stretch Limitations L; KTOS & fig 4 with strap     Lumbar Exercises: Aerobic   Stationary Bike lvl 2 x 6'     Lumbar Exercises:  Standing   Functional Squats 10 reps;5 seconds   Functional Squats Limitations using TRX; + ab set  VC/TC for knees to remain behind toes   Forward Lunge 5 reps;5 seconds   Forward Lunge Limitations Using TRX; + ab set; both legs in front     Lumbar Exercises: Supine   Bridge 10 reps;5 seconds   Bridge Limitations + ab set   Other Supine Lumbar Exercises Bridge with unilateral clamshell; 10 reps; 3" holds; red TB     Shoulder Exercises: ROM/Strengthening   Cybex Row 15 reps  35# + ab set                PT Education - 04/07/16 1639    Education provided Yes   Education Details Update to HEP   Person(s) Educated Patient   Methods Explanation;Demonstration;Handout   Comprehension Verbalized understanding;Returned demonstration          PT Short Term Goals - 03/29/16 1647      PT SHORT TERM GOAL #1   Title be independent in initial HEP by 04/15/16.   Time --   Period --   Status On-going     PT SHORT  TERM GOAL #2   Title sit with Rt=Lt weightbearing > or = to 50% of the time at work by 04/15/16.   Time --   Period --   Status On-going     PT SHORT TERM GOAL #3   Title demonstrate and verbalize understanding of correct body mechanics for lumbar protection by 04/15/16.   Time --   Period --   Status On-going     PT SHORT TERM GOAL #4   Title report a 30% reduction in LBP/Lt SI joint pain with daily activity by 04/15/16.   Time --   Period --   Status On-going           PT Long Term Goals - 03/29/16 1649      PT LONG TERM GOAL #1   Title demonstrate independence in advanced HEP by 05/11/16.   Status On-going     PT LONG TERM GOAL #2   Title reduce FOTO to < or = to 30% limitation by 05/11/16.   Status On-going     PT LONG TERM GOAL #3   Title initiate an exercise routine including walking/recumbent bike and weights and verbalize how to safely progress by 05/11/16.   Status On-going     PT LONG TERM GOAL #4   Title report a 60% reduction in LBP/Lt SI  joint pain with daily activity by 05/11/16.   Status On-going     PT LONG TERM GOAL #5   Title sit with Rt=Lt weightbearing > or = to 75% of the time at work by 05/11/16.   Status On-going               Plan - 04/07/16 1547    Clinical Impression Statement Pt is reporting she is continuing to have pain in the L buttock/low back & L groin area. She has been trying to do her exercises at home but has not been consistent with them. Today's session focused on reviewing her HEP and progressing her core/LE strengthening. She was able to tolerate all of her exercises, new & old, well and with no reports of increased pain. She was reporting muscle fatigue following squats and lunges using the TRX. She did require verbal & tactile cues for her news to remain behind her toes during the squats but had correct form with her other exercises. She will continue to benefit from skilled therapy to increase core/LE strength, reduce low back pain, and improve overall function.    Rehab Potential Good   PT Treatment/Interventions ADLs/Self Care Home Management;Cryotherapy;Electrical Stimulation;Iontophoresis 4mg /ml Dexamethasone;Functional mobility training;Gait training;Stair training;Ultrasound;Moist Heat;Therapeutic activities;Therapeutic exercise;Neuromuscular re-education;Patient/family education;Passive range of motion;Manual techniques;Dry needling;Taping   PT Next Visit Plan Review HEP & posture/body mechanics handouts; progress core/LE strengthening; assess SI/pelvic alignment, as indicated; Manual & possible dry needling to Lt gluteals & lumbar spine   Consulted and Agree with Plan of Care Patient      Patient will benefit from skilled therapeutic intervention in order to improve the following deficits and impairments:  Postural dysfunction, Impaired flexibility, Pain, Improper body mechanics, Decreased activity tolerance, Increased muscle spasms, Decreased range of motion  Visit Diagnosis: Chronic  left-sided low back pain without sciatica  Stiffness of left hip, not elsewhere classified  Stiffness of right hip, not elsewhere classified  Cramp and spasm     Problem List Patient Active Problem List   Diagnosis Date Noted  . Acute stress disorder 03/09/2016  . Allergic rhinitis 03/09/2016  . Moderate major depression, single episode ([place]) 03/09/2016  .  Eczema of hand 01/20/2016    Lauralee Evener, SPT 04/07/2016, 4:40 PM  [facility] [address]  [place], [place], [address] Phone: [phone]   Fax:  [phone]  Name: Maisen Redler MRN: [id number] Date of Birth: 1975/09/27

## 2016-04-08 DIAGNOSIS — F4310 Post-traumatic stress disorder, unspecified: Secondary | ICD-10-CM | POA: Diagnosis not present

## 2016-04-08 DIAGNOSIS — F331 Major depressive disorder, recurrent, moderate: Secondary | ICD-10-CM | POA: Diagnosis not present

## 2016-04-12 ENCOUNTER — Ambulatory Visit: Payer: BLUE CROSS/BLUE SHIELD | Admitting: Physical Therapy

## 2016-04-12 DIAGNOSIS — R252 Cramp and spasm: Secondary | ICD-10-CM

## 2016-04-12 DIAGNOSIS — M545 Low back pain, unspecified: Secondary | ICD-10-CM

## 2016-04-12 DIAGNOSIS — Z131 Encounter for screening for diabetes mellitus: Secondary | ICD-10-CM | POA: Diagnosis not present

## 2016-04-12 DIAGNOSIS — M25651 Stiffness of right hip, not elsewhere classified: Secondary | ICD-10-CM

## 2016-04-12 DIAGNOSIS — Z136 Encounter for screening for cardiovascular disorders: Secondary | ICD-10-CM | POA: Diagnosis not present

## 2016-04-12 DIAGNOSIS — M25652 Stiffness of left hip, not elsewhere classified: Secondary | ICD-10-CM

## 2016-04-12 DIAGNOSIS — G8929 Other chronic pain: Secondary | ICD-10-CM | POA: Diagnosis not present

## 2016-04-12 DIAGNOSIS — Z13220 Encounter for screening for lipoid disorders: Secondary | ICD-10-CM | POA: Diagnosis not present

## 2016-04-12 DIAGNOSIS — Z713 Dietary counseling and surveillance: Secondary | ICD-10-CM | POA: Diagnosis not present

## 2016-04-12 NOTE — Therapy (Signed)
[facility] [address]  [place], [place], [address] Phone: [phone]   Fax:  [phone]  Physical Therapy Treatment  Patient Details  Name: Sydney Beck MRN: [id number] Date of Birth: 05/20/1975 Referring Provider: Delia Chimes, MD  Encounter Date: 04/12/2016      PT End of Session - 04/12/16 1700    Visit Number 4   Number of Visits 16   Date for PT Re-Evaluation 05/11/16   PT Start Time 1700   PT Stop Time 1742   PT Time Calculation (min) 42 min   Activity Tolerance Patient tolerated treatment well   Behavior During Therapy [facility] for tasks assessed/performed      Past Medical History:  Diagnosis Date  . Anxiety   . Depression     Past Surgical History:  Procedure Laterality Date  . CYST REMOVAL NECK    . KNEE SURGERY Right     There were no vitals filed for this visit.      Subjective Assessment - 04/12/16 1703    Subjective Pt reports pain has greatly decreased and is only intermittent at present. Admits to limited compliance with HEP but states she has been working out.   Patient Stated Goals reduce pain, improve symmetry with sitting and standing, return to exercise   Currently in Pain? No/denies   Pain Score 0-No pain   Pain Onset More than a month ago                         [facility] Adult PT Treatment/Exercise - 04/12/16 1700      Self-Care   Self-Care Posture   Posture Review of posture and body mechanics handout     Lumbar Exercises: Stretches   Lower Trunk Rotation 2 reps;30 seconds   ITB Stretch 30 seconds;4 reps   ITB Stretch Limitations supine with strap & standing x 2 each     Lumbar Exercises: Aerobic   Stationary Bike lvl 3 x 6'     Lumbar Exercises: Standing   Functional Squats 15 reps;5 seconds   Functional Squats Limitations TRX + ab set, cues to keep wieght on heels avoiding knees passing toes   Forward Lunge 10 reps;5 seconds   Forward Lunge  Limitations alt with single UE support on counter; cues for downward motion to avoid knees past toes   Other Standing Lumbar Exercises B pallof press with red TB x10 each   Other Standing Lumbar Exercises B sidestepping & fwd monster walk with red TB 2x20 ft     Lumbar Exercises: Quadruped   Straight Leg Raise 10 reps;3 seconds     Shoulder Exercises: ROM/Strengthening   Cybex Row 15 reps  35# + ab set                  PT Short Term Goals - 04/12/16 1706      PT SHORT TERM GOAL #1   Title be independent in initial HEP by 04/15/16.   Status Achieved     PT SHORT TERM GOAL #2   Title sit with Rt=Lt weightbearing > or = to 50% of the time at work by 04/15/16.   Status Achieved     PT SHORT TERM GOAL #3   Title demonstrate and verbalize understanding of correct body mechanics for lumbar protection by 04/15/16.   Status Achieved     PT SHORT TERM GOAL #4   Title report a 30% reduction  in LBP/Lt SI joint pain with daily activity by 04/15/16.   Status Achieved           PT Long Term Goals - 03/29/16 1649      PT LONG TERM GOAL #1   Title demonstrate independence in advanced HEP by 05/11/16.   Status On-going     PT LONG TERM GOAL #2   Title reduce FOTO to < or = to 30% limitation by 05/11/16.   Status On-going     PT LONG TERM GOAL #3   Title initiate an exercise routine including walking/recumbent bike and weights and verbalize how to safely progress by 05/11/16.   Status On-going     PT LONG TERM GOAL #4   Title report a 60% reduction in LBP/Lt SI joint pain with daily activity by 05/11/16.   Status On-going     PT LONG TERM GOAL #5   Title sit with Rt=Lt weightbearing > or = to 75% of the time at work by 05/11/16.   Status On-going               Plan - 04/12/16 1707    Clinical Impression Statement Pt reporting significant improvement in pain since start of PT, with pain now only intermittent. Pt reporting limited compliance with HEP and states she  eliminated the ITB stretch due increased pain but when stretch reviewed, pt demonstrating incorrect technique. After correction of technique, pt noting benefit from stretch and pt also provided with standing alternative version of stretch. Posture and body mechanics reviewed with pt acknowledging ways to improve how she performs everyday tasks. All STGs met att this time and pt will continue to benefit from further PT to restore normal flexibility and increase core/LE strength to reduce pain and improve function.   Rehab Potential Good   PT Treatment/Interventions ADLs/Self Care Home Management;Cryotherapy;Electrical Stimulation;Iontophoresis 74m/ml Dexamethasone;Functional mobility training;Gait training;Stair training;Ultrasound;Moist Heat;Therapeutic activities;Therapeutic exercise;Neuromuscular re-education;Patient/family education;Passive range of motion;Manual techniques;Dry needling;Taping   PT Next Visit Plan Progress core/LE strengthening; assess SI/pelvic alignment as indicated; Manual & possible dry needling to Lt gluteals & lumbar spine   Consulted and Agree with Plan of Care Patient      Patient will benefit from skilled therapeutic intervention in order to improve the following deficits and impairments:  Postural dysfunction, Impaired flexibility, Pain, Improper body mechanics, Decreased activity tolerance, Increased muscle spasms, Decreased range of motion  Visit Diagnosis: Chronic left-sided low back pain without sciatica  Stiffness of left hip, not elsewhere classified  Stiffness of right hip, not elsewhere classified  Cramp and spasm     Problem List Patient Active Problem List   Diagnosis Date Noted  . Acute stress disorder 03/09/2016  . Allergic rhinitis 03/09/2016  . Moderate major depression[place] 03/09/2016  . Eczema of hand 01/20/2016    JPercival Spanish PT, MPT 04/12/2016, 5:56 PM  [facility] [place] [address]Phone: [phone]  Fax:  [phone] Name: Sydney RaginMRN: [id number] of Birth: 9Jul 09, 1977

## 2016-04-14 ENCOUNTER — Ambulatory Visit: Payer: BLUE CROSS/BLUE SHIELD | Attending: Family Medicine

## 2016-04-14 DIAGNOSIS — M545 Low back pain, unspecified: Secondary | ICD-10-CM

## 2016-04-14 DIAGNOSIS — M25651 Stiffness of right hip, not elsewhere classified: Secondary | ICD-10-CM | POA: Diagnosis not present

## 2016-04-14 DIAGNOSIS — G8929 Other chronic pain: Secondary | ICD-10-CM | POA: Diagnosis not present

## 2016-04-14 DIAGNOSIS — R252 Cramp and spasm: Secondary | ICD-10-CM | POA: Diagnosis not present

## 2016-04-14 DIAGNOSIS — M25652 Stiffness of left hip, not elsewhere classified: Secondary | ICD-10-CM | POA: Diagnosis not present

## 2016-04-14 NOTE — Therapy (Signed)
[facility] [address]  [place], [place], [address] Phone: [phone]   Fax:  [phone]  Physical Therapy Treatment  Patient Details  Name: Sydney Beck MRN: [id number] Date of Birth: 13-Feb-1976 Referring Provider: Delia Chimes, MD  Encounter Date: 04/14/2016      PT End of Session - 04/14/16 1704    Visit Number 5   Number of Visits 16   Date for PT Re-Evaluation 05/11/16   PT Start Time [id number]   PT Stop Time 1747   PT Time Calculation (min) 43 min   Activity Tolerance Patient tolerated treatment well   Behavior During Therapy [facility] for tasks assessed/performed      Past Medical History:  Diagnosis Date  . Anxiety   . Depression     Past Surgical History:  Procedure Laterality Date  . CYST REMOVAL NECK    . KNEE SURGERY Right     There were no vitals filed for this visit.      Subjective Assessment - 04/14/16 1703    Subjective Pt. seen initially today in high heel boots.  Pt. reporting muscular soreness in L thigh from attending gym. Pt. reporting she still feels pain while sleeping on side in bed and leaning over kitchen counter making candles.     Patient Stated Goals reduce pain, improve symmetry with sitting and standing, return to exercise   Currently in Pain? No/denies   Pain Score 0-No pain   Multiple Pain Sites No           [facility] Adult PT Treatment/Exercise - 04/14/16 1711      Self-Care   Self-Care Other Self-Care Comments   Posture Review of proper technique standing at kitchen counter, lifting objects      Exercises   Exercises Shoulder     Lumbar Exercises: Stretches   Lower Trunk Rotation 5 reps;10 seconds   ITB Stretch 30 seconds;2 reps   ITB Stretch Limitations Supine with strap     Lumbar Exercises: Aerobic   Stationary Bike lvl 3 x 4'     Lumbar Exercises: Standing   Row Both;10 reps   Theraband Level (Row) Level 3 (Green)   Row Limitations 3" hold B green  TB x 10 rows each side   staggered stance    Shoulder Extension AROM;Strengthening;10 reps;Theraband   Theraband Level (Shoulder Extension) Level 3 (Green)   Other Standing Lumbar Exercises B pallof press with  TB x 15 each     Lumbar Exercises: Quadruped   Madcat/Old Horse 10 reps   Madcat/Old Horse Limitations 3" hold each way   Straight Leg Raise 10 reps;3 seconds   Straight Leg Raises Limitations with peanut p-ball support   cues to prevent lumbar rotation     Shoulder Exercises: Supine   Other Supine Exercises Anterior chest stretch laying on 1/2 foam bolster in multi-angle chest stretch x 30 sec each way     Shoulder Exercises: ROM/Strengthening   Cybex Row 15 reps  2 sets      Shoulder Exercises: Stretch   Corner Stretch 3 reps;30 seconds   Corner Stretch Limitations at doorframe              PT Short Term Goals - 04/12/16 1706      PT SHORT TERM GOAL #1   Title be independent in initial HEP by 04/15/16.   Status Achieved     PT SHORT TERM GOAL #2   Title sit with Rt=Lt  weightbearing > or = to 50% of the time at work by 04/15/16.   Status Achieved     PT SHORT TERM GOAL #3   Title demonstrate and verbalize understanding of correct body mechanics for lumbar protection by 04/15/16.   Status Achieved     PT SHORT TERM GOAL #4   Title report a 30% reduction in LBP/Lt SI joint pain with daily activity by 04/15/16.   Status Achieved           PT Long Term Goals - 03/29/16 1649      PT LONG TERM GOAL #1   Title demonstrate independence in advanced HEP by 05/11/16.   Status On-going     PT LONG TERM GOAL #2   Title reduce FOTO to < or = to 30% limitation by 05/11/16.   Status On-going     PT LONG TERM GOAL #3   Title initiate an exercise routine including walking/recumbent bike and weights and verbalize how to safely progress by 05/11/16.   Status On-going     PT LONG TERM GOAL #4   Title report a 60% reduction in LBP/Lt SI joint pain with daily activity by  05/11/16.   Status On-going     PT LONG TERM GOAL #5   Title sit with Rt=Lt weightbearing > or = to 75% of the time at work by 05/11/16.   Status On-going               Plan - 04/14/16 1705    Clinical Impression Statement Pt. seen in therapy today with high heel boots thus treatment limited by this.  Treatment focusing on mid back stretching, anterior chest stretching, and core activation with standing band work.  Pt. tolerating treatment well today.  Pt. reporting she still has LBP while making candles standing at kitchen counter.  Body mechanics at kitchen counter reviewed with pt. today.  Pt. will continue to benefit from further skilled therapy to improve body mechanics with everyday tasks, to decrease pain, and improve functional activity tolerance.     PT Treatment/Interventions ADLs/Self Care Home Management;Cryotherapy;Electrical Stimulation;Iontophoresis 4mg /ml Dexamethasone;Functional mobility training;Gait training;Stair training;Ultrasound;Moist Heat;Therapeutic activities;Therapeutic exercise;Neuromuscular re-education;Patient/family education;Passive range of motion;Manual techniques;Dry needling;Taping   PT Next Visit Plan Progress core/LE strengthening; assess SI/pelvic alignment as indicated; Manual & possible dry needling to Lt gluteals & lumbar spine      Patient will benefit from skilled therapeutic intervention in order to improve the following deficits and impairments:  Postural dysfunction, Impaired flexibility, Pain, Improper body mechanics, Decreased activity tolerance, Increased muscle spasms, Decreased range of motion  Visit Diagnosis: Chronic left-sided low back pain without sciatica  Stiffness of left hip, not elsewhere classified  Stiffness of right hip, not elsewhere classified  Cramp and spasm     Problem List Patient Active Problem List   Diagnosis Date Noted  . Acute stress disorder 03/09/2016  . Allergic rhinitis 03/09/2016  . Moderate major  depression, single episode ([place]) 03/09/2016  . Eczema of hand 01/20/2016    Bess Harvest, PTA 04/14/16 5:53 PM  [facility] [address]  [place], [place], [address] Phone: [phone]   Fax:  [phone]  Name: Sydney Beck MRN: [id number] Date of Birth: Jul 27, 1975

## 2016-04-18 ENCOUNTER — Ambulatory Visit: Payer: BLUE CROSS/BLUE SHIELD | Admitting: Physical Therapy

## 2016-04-21 ENCOUNTER — Ambulatory Visit: Payer: BLUE CROSS/BLUE SHIELD

## 2016-04-21 DIAGNOSIS — M25651 Stiffness of right hip, not elsewhere classified: Secondary | ICD-10-CM

## 2016-04-21 DIAGNOSIS — R252 Cramp and spasm: Secondary | ICD-10-CM

## 2016-04-21 DIAGNOSIS — M545 Low back pain, unspecified: Secondary | ICD-10-CM

## 2016-04-21 DIAGNOSIS — M25652 Stiffness of left hip, not elsewhere classified: Secondary | ICD-10-CM | POA: Diagnosis not present

## 2016-04-21 DIAGNOSIS — G8929 Other chronic pain: Secondary | ICD-10-CM | POA: Diagnosis not present

## 2016-04-21 NOTE — Therapy (Signed)
[facility] [address]  [place], [place], [address] Phone: [phone]   Fax:  [phone]  Physical Therapy Treatment  Patient Details  Name: Sydney Beck MRN: [id number] Date of Birth: 02/12/1976 Referring Provider: Delia Chimes, MD  Encounter Date: 04/21/2016      PT End of Session - 04/21/16 1704    Visit Number 6   Number of Visits 16   Date for PT Re-Evaluation 05/11/16   PT Start Time 1701   PT Stop Time 1745   PT Time Calculation (min) 44 min   Activity Tolerance Patient tolerated treatment well   Behavior During Therapy [facility] for tasks assessed/performed      Past Medical History:  Diagnosis Date  . Anxiety   . Depression     Past Surgical History:  Procedure Laterality Date  . CYST REMOVAL NECK    . KNEE SURGERY Right     There were no vitals filed for this visit.      Subjective Assessment - 04/21/16 1702    Subjective Pt. reporting bilateral knees are sore and, "crunchy" today however hip and lower back are pain free.     Patient Stated Goals reduce pain, improve symmetry with sitting and standing, return to exercise   Currently in Pain? No/denies   Pain Score 0-No pain   Multiple Pain Sites No                         [facility] Adult PT Treatment/Exercise - 04/21/16 1710      Lumbar Exercises: Aerobic   Stationary Bike lvl 3 x 6'     Lumbar Exercises: Machines for Strengthening   Cybex Knee Extension 20# x 15 reps; B con/ecc; reviewed due to pt. report of pain with this at gym    Cybex Knee Flexion 25# x 15 reps; B conc/ecc; reviewed due to pt. report of performing at gym     Lumbar Exercises: Standing   Functional Squats 15 reps;5 seconds   Functional Squats Limitations TRX + ab set, cues to keep wieght on heels avoiding knees passing toes   Forward Lunge 5 reps   Forward Lunge Limitations unable to perform today due to knee soreness    Row Both;15 reps   Row  Limitations on TRX ~ 20 dg lean back   Other Standing Lumbar Exercises B pallof press with blue TB x 15 each in staggered stance   Other Standing Lumbar Exercises B sidestepping & fwd monster walk with green TB 2x20 ft     Lumbar Exercises: Quadruped   Madcat/Old Horse 10 reps   Madcat/Old Horse Limitations 5" hold each way   Opposite Arm/Leg Raise 10 reps;3 seconds;Right arm/Left leg;Left arm/Right leg     Shoulder Exercises: ROM/Strengthening   Cybex Row 15 reps  3" hold    Cybex Row Limitations 35#    Other ROM/Strengthening Exercises BATCA pull down 25# x 10 reps   cues for scap. squeeze                  PT Short Term Goals - 04/12/16 1706      PT SHORT TERM GOAL #1   Title be independent in initial HEP by 04/15/16.   Status Achieved     PT SHORT TERM GOAL #2   Title sit with Rt=Lt weightbearing > or = to 50% of the time at work by 04/15/16.   Status Achieved  PT SHORT TERM GOAL #3   Title demonstrate and verbalize understanding of correct body mechanics for lumbar protection by 04/15/16.   Status Achieved     PT SHORT TERM GOAL #4   Title report a 30% reduction in LBP/Lt SI joint pain with daily activity by 04/15/16.   Status Achieved           PT Long Term Goals - 03/29/16 1649      PT LONG TERM GOAL #1   Title demonstrate independence in advanced HEP by 05/11/16.   Status On-going     PT LONG TERM GOAL #2   Title reduce FOTO to < or = to 30% limitation by 05/11/16.   Status On-going     PT LONG TERM GOAL #3   Title initiate an exercise routine including walking/recumbent bike and weights and verbalize how to safely progress by 05/11/16.   Status On-going     PT LONG TERM GOAL #4   Title report a 60% reduction in LBP/Lt SI joint pain with daily activity by 05/11/16.   Status On-going     PT LONG TERM GOAL #5   Title sit with Rt=Lt weightbearing > or = to 75% of the time at work by 05/11/16.   Status On-going               Plan - 04/21/16  1725    Clinical Impression Statement Pt. reporting lower back pain free today however B knee soreness which she feels may have started with knee extension machine at gym.  Proper technique with knee extension machine reviewed and demo'd with pt. today.  Pt. able to perform well however did have B knee pain with end range extension.  Pt. instructed to avoid full extension with this activity at gym.  Today's treatment avoiding lunging and deep squatting due to B knee soreness.  Keishla tolerating progression of lumbopelvic strengthening activity well today with addition of seated core stability on p-ball and addition of lat pulldown.  Pt. progressing well reporting she feels like she is making progress with therapy.  Pt. will continue to benefit from further skilled therapy to maximize function and decrease pain with leisure activities.      PT Treatment/Interventions ADLs/Self Care Home Management;Cryotherapy;Electrical Stimulation;Iontophoresis 4mg /ml Dexamethasone;Functional mobility training;Gait training;Stair training;Ultrasound;Moist Heat;Therapeutic activities;Therapeutic exercise;Neuromuscular re-education;Patient/family education;Passive range of motion;Manual techniques;Dry needling;Taping   PT Next Visit Plan Progress core/LE strengthening; assess SI/pelvic alignment as indicated; Manual & possible dry needling to Lt gluteals & lumbar spine      Patient will benefit from skilled therapeutic intervention in order to improve the following deficits and impairments:  Postural dysfunction, Impaired flexibility, Pain, Improper body mechanics, Decreased activity tolerance, Increased muscle spasms, Decreased range of motion  Visit Diagnosis: Chronic left-sided low back pain without sciatica  Stiffness of left hip, not elsewhere classified  Stiffness of right hip, not elsewhere classified  Cramp and spasm     Problem List Patient Active Problem List   Diagnosis Date Noted  . Acute stress  disorder 03/09/2016  . Allergic rhinitis 03/09/2016  . Moderate major depression, single episode ([place]) 03/09/2016  . Eczema of hand 01/20/2016    Bess Harvest, PTA 04/21/16 6:04 PM  [facility] [address]  [place], [place], [address] Phone: [phone]   Fax:  [phone]  Name: Sydney Beck MRN: [id number] Date of Birth: Mar 21, 1975

## 2016-04-25 ENCOUNTER — Ambulatory Visit: Payer: BLUE CROSS/BLUE SHIELD | Admitting: Physical Therapy

## 2016-04-28 ENCOUNTER — Ambulatory Visit: Payer: BLUE CROSS/BLUE SHIELD | Admitting: Physical Therapy

## 2016-05-01 DIAGNOSIS — F331 Major depressive disorder, recurrent, moderate: Secondary | ICD-10-CM | POA: Diagnosis not present

## 2016-05-01 DIAGNOSIS — F4310 Post-traumatic stress disorder, unspecified: Secondary | ICD-10-CM | POA: Diagnosis not present

## 2016-05-02 ENCOUNTER — Ambulatory Visit: Payer: BLUE CROSS/BLUE SHIELD

## 2016-05-04 ENCOUNTER — Ambulatory Visit: Payer: BLUE CROSS/BLUE SHIELD | Admitting: Physical Therapy

## 2016-05-04 DIAGNOSIS — M545 Low back pain, unspecified: Secondary | ICD-10-CM

## 2016-05-04 DIAGNOSIS — M25652 Stiffness of left hip, not elsewhere classified: Secondary | ICD-10-CM | POA: Diagnosis not present

## 2016-05-04 DIAGNOSIS — R252 Cramp and spasm: Secondary | ICD-10-CM | POA: Diagnosis not present

## 2016-05-04 DIAGNOSIS — G8929 Other chronic pain: Secondary | ICD-10-CM

## 2016-05-04 DIAGNOSIS — M25651 Stiffness of right hip, not elsewhere classified: Secondary | ICD-10-CM

## 2016-05-04 NOTE — Therapy (Signed)
[facility] [address]  [place], [place], [address] Phone: [phone]   Fax:  [phone]  Physical Therapy Treatment  Patient Details  Name: Sydney Beck MRN: [id number] Date of Birth: Nov 21, 1975 Referring Provider: Delia Chimes, MD  Encounter Date: 05/04/2016      PT End of Session - 05/04/16 1147    Visit Number 7   Number of Visits 16   Date for PT Re-Evaluation 05/11/16   PT Start Time 1147   PT Stop Time 1232   PT Time Calculation (min) 45 min   Activity Tolerance Patient tolerated treatment well   Behavior During Therapy [facility] for tasks assessed/performed      Past Medical History:  Diagnosis Date  . Anxiety   . Depression     Past Surgical History:  Procedure Laterality Date  . CYST REMOVAL NECK    . KNEE SURGERY Right     There were no vitals filed for this visit.      Subjective Assessment - 05/04/16 1149    Subjective "I'm doing so much better." Reports completing HEP every morning and going to the gym (doing her own program, not the circuit program). Has started taking fish oil and thinks this may be helping as well.   Patient Stated Goals reduce pain, improve symmetry with sitting and standing, return to exercise   Currently in Pain? No/denies                         [facility] Adult PT Treatment/Exercise - 05/04/16 1147      Lumbar Exercises: Aerobic   Stationary Bike lvl 3 x 6'     Lumbar Exercises: Machines for Strengthening   Cybex Knee Extension 20# x 15 - B con/ecc   Cybex Knee Flexion 25# x 15 - B conc/ecc   Leg Press 25# x15     Lumbar Exercises: Standing   Row Strengthening;15 reps;Theraband   Theraband Level (Row) Level 4 (Blue)   Row Limitations TRX row x15   Shoulder Extension Strengthening;15 reps;Theraband   Theraband Level (Shoulder Extension) Level 4 (Blue)   Other Standing Lumbar Exercises B pallof press + shoulder flexion with blue TB x15     Lumbar Exercises: Quadruped   Straight Leg Raise 10 reps;3 seconds   Straight Leg Raises Limitations no support   Opposite Arm/Leg Raise Right arm/Left leg;Left arm/Right leg;5 reps;3 seconds   Opposite Arm/Leg Raise Limitations no support     Knee/Hip Exercises: Stretches   Hip Flexor Stretch Both;2 reps;30 seconds   Hip Flexor Stretch Limitations standing lunge stretch with rear leg on chair seat                PT Education - 05/04/16 1235    Education provided Yes   Education Details HEP & gym program review & clarification as indicated   Person(s) Educated Patient   Methods Explanation;Demonstration;Handout   Comprehension Verbalized understanding;Returned demonstration          PT Short Term Goals - 04/12/16 1706      PT SHORT TERM GOAL #1   Title be independent in initial HEP by 04/15/16.   Status Achieved     PT SHORT TERM GOAL #2   Title sit with Rt=Lt weightbearing > or = to 50% of the time at work by 04/15/16.   Status Achieved     PT SHORT TERM GOAL #3   Title demonstrate and verbalize  understanding of correct body mechanics for lumbar protection by 04/15/16.   Status Achieved     PT SHORT TERM GOAL #4   Title report a 30% reduction in LBP/Lt SI joint pain with daily activity by 04/15/16.   Status Achieved           PT Long Term Goals - 05/04/16 1236      PT LONG TERM GOAL #1   Title demonstrate independence in advanced HEP by 05/11/16.   Status On-going     PT LONG TERM GOAL #2   Title reduce FOTO to < or = to 30% limitation by 05/11/16.   Status On-going     PT LONG TERM GOAL #3   Title initiate an exercise routine including walking/recumbent bike and weights and verbalize how to safely progress by 05/11/16.   Status Achieved     PT LONG TERM GOAL #4   Title report a 60% reduction in LBP/Lt SI joint pain with daily activity by 05/11/16.   Status Achieved     PT LONG TERM GOAL #5   Title sit with Rt=Lt weightbearing > or = to 75% of the time  at work by 05/11/16.   Status On-going               Plan - 05/04/16 1152    Clinical Impression Statement Pt reporting significant improvement with PT with pain mostly resolved (only rare brief instances of pain recently). Reports good compliance with HEP and gym program and able to demonstrate most exercises/activities well with only a few corrections needed. Pt pleased with progress and in agreement with plan for discharge after final HEP review and goal assessment at next visit.   Rehab Potential Good   PT Treatment/Interventions ADLs/Self Care Home Management;Cryotherapy;Electrical Stimulation;Iontophoresis 4mg /ml Dexamethasone;Functional mobility training;Gait training;Stair training;Ultrasound;Moist Heat;Therapeutic activities;Therapeutic exercise;Neuromuscular re-education;Patient/family education;Passive range of motion;Manual techniques;Dry needling;Taping   PT Next Visit Plan Final HEP/gym program review as indicated; Goal assessment; Discharge   Consulted and Agree with Plan of Care Patient      Patient will benefit from skilled therapeutic intervention in order to improve the following deficits and impairments:  Postural dysfunction, Impaired flexibility, Pain, Improper body mechanics, Decreased activity tolerance, Increased muscle spasms, Decreased range of motion  Visit Diagnosis: Chronic left-sided low back pain without sciatica  Stiffness of left hip, not elsewhere classified  Stiffness of right hip, not elsewhere classified  Cramp and spasm     Problem List Patient Active Problem List   Diagnosis Date Noted  . Acute stress disorder 03/09/2016  . Allergic rhinitis 03/09/2016  . Moderate major depression, single episode ([place]) 03/09/2016  . Eczema of hand 01/20/2016    Percival Spanish, PT, MPT 05/04/2016, 12:41 PM  [facility] [address]  [place], [place], [address] Phone: [phone]    Fax:  [phone]  Name: Sydney Beck MRN: [id number] Date of Birth: 05/12/1975

## 2016-05-05 ENCOUNTER — Ambulatory Visit: Payer: BLUE CROSS/BLUE SHIELD | Admitting: Physical Therapy

## 2016-05-09 ENCOUNTER — Ambulatory Visit: Payer: BLUE CROSS/BLUE SHIELD | Admitting: Physical Therapy

## 2016-05-09 DIAGNOSIS — M545 Low back pain, unspecified: Secondary | ICD-10-CM

## 2016-05-09 DIAGNOSIS — M25652 Stiffness of left hip, not elsewhere classified: Secondary | ICD-10-CM

## 2016-05-09 DIAGNOSIS — M25651 Stiffness of right hip, not elsewhere classified: Secondary | ICD-10-CM | POA: Diagnosis not present

## 2016-05-09 DIAGNOSIS — G8929 Other chronic pain: Secondary | ICD-10-CM | POA: Diagnosis not present

## 2016-05-09 DIAGNOSIS — R252 Cramp and spasm: Secondary | ICD-10-CM

## 2016-05-09 NOTE — Therapy (Signed)
[facility] [address]  [place], [place], [address] Phone: [phone]   Fax:  [phone]  Physical Therapy Treatment  Patient Details  Name: Sydney Beck MRN: [id number] Date of Birth: September 20, 1975 Referring Provider: Delia Chimes, MD  Encounter Date: 05/09/2016      PT End of Session - 05/09/16 1702    Visit Number 8   Number of Visits 16   Date for PT Re-Evaluation 05/11/16   PT Start Time 1702   PT Stop Time 1736   PT Time Calculation (min) 34 min   Activity Tolerance Patient tolerated treatment well   Behavior During Therapy [facility] for tasks assessed/performed      Past Medical History:  Diagnosis Date  . Anxiety   . Depression     Past Surgical History:  Procedure Laterality Date  . CYST REMOVAL NECK    . KNEE SURGERY Right     There were no vitals filed for this visit.      Subjective Assessment - 05/09/16 1706    Subjective Pt noting pain has been good overall, but did note some "pinching" on sat when the weather was bad.   Patient Stated Goals reduce pain, improve symmetry with sitting and standing, return to exercise   Currently in Pain? No/denies            [facility] PT Assessment - 05/09/16 1702      Assessment   Medical Diagnosis SI joint dysfunction   Onset Date/Surgical Date 03/16/12     Observation/Other Assessments   Focus on Therapeutic Outcomes (FOTO)  Lumbar spine - 94% (6% limitation)     AROM   Overall AROM  Within functional limits for tasks performed   AROM Assessment Site Lumbar                     [facility] Adult PT Treatment/Exercise - 05/09/16 1702      Lumbar Exercises: Aerobic   Stationary Bike NuStep - lvl 6 x 6' (LE only)     Lumbar Exercises: Standing   Functional Squats 20 reps;5 seconds   Functional Squats Limitations TRX - good form demonstrated   Forward Lunge 10 reps;5 seconds   Forward Lunge Limitations alt with single UE support on  back of chair; cues for downward motion to avoid knees past toes   Row Strengthening;15 reps   Row Limitations TRX                   PT Short Term Goals - 04/12/16 1706      PT SHORT TERM GOAL #1   Title be independent in initial HEP by 04/15/16.   Status Achieved     PT SHORT TERM GOAL #2   Title sit with Rt=Lt weightbearing > or = to 50% of the time at work by 04/15/16.   Status Achieved     PT SHORT TERM GOAL #3   Title demonstrate and verbalize understanding of correct body mechanics for lumbar protection by 04/15/16.   Status Achieved     PT SHORT TERM GOAL #4   Title report a 30% reduction in LBP/Lt SI joint pain with daily activity by 04/15/16.   Status Achieved           PT Long Term Goals - 05/09/16 1714      PT LONG TERM GOAL #1   Title demonstrate independence in advanced HEP by 05/11/16.   Status Achieved  PT LONG TERM GOAL #2   Title reduce FOTO to < or = to 30% limitation by 05/11/16.   Status Achieved     PT LONG TERM GOAL #3   Title initiate an exercise routine including walking/recumbent bike and weights and verbalize how to safely progress by 05/11/16.   Status Achieved     PT LONG TERM GOAL #4   Title report a 60% reduction in LBP/Lt SI joint pain with daily activity by 05/11/16.   Status Achieved     PT LONG TERM GOAL #5   Title sit with Rt=Lt weightbearing > or = to 75% of the time at work by 05/11/16.   Status Achieved  pt reporting she still notes herself leaning to 1 side at times, but feels that she is able to sit with even weightbearing at least 75% of the time               Plan - 05/09/16 1708    Clinical Impression Statement Pt has demonstrated excellent progress with PT with nearly complete resolution of her pain and able to resume normal daily tasks including gym workout w/o limitation due to pain. Pt is independent with HEP and feels confident with transition to HEP/gym program. All goals met for this episode, therefore  will proceed with discharge from PT at this time.   Rehab Potential Good   PT Treatment/Interventions ADLs/Self Care Home Management;Cryotherapy;Electrical Stimulation;Iontophoresis 52m/ml Dexamethasone;Functional mobility training;Gait training;Stair training;Ultrasound;Moist Heat;Therapeutic activities;Therapeutic exercise;Neuromuscular re-education;Patient/family education;Passive range of motion;Manual techniques;Dry needling;Taping   PT Next Visit Plan Discharge   Consulted and Agree with Plan of Care Patient      Patient will benefit from skilled therapeutic intervention in order to improve the following deficits and impairments:  Postural dysfunction, Impaired flexibility, Pain, Improper body mechanics, Decreased activity tolerance, Increased muscle spasms, Decreased range of motion  Visit Diagnosis: Chronic left-sided low back pain without sciatica  Stiffness of left hip, not elsewhere classified  Stiffness of right hip, not elsewhere classified  Cramp and spasm     Problem List Patient Active Problem List   Diagnosis Date Noted  . Acute stress disorder 03/09/2016  . Allergic rhinitis 03/09/2016  . Moderate major depression, single episode ([place] 03/09/2016  . Eczema of hand 01/20/2016    PHYSICAL THERAPY DISCHARGE SUMMARY  Visits from Start of Care: 8  Current functional level related to goals / functional outcomes:   Refer to above clinical impression.   Remaining deficits:   As above.   Education / Equipment:   HEP & gym program, pTraining and development officereducation  Plan: Patient agrees to discharge.  Patient goals were not met. Patient is being discharged due to meeting the stated rehab goals.  ?????     JPercival Spanish PT, MPT 05/09/2016, 6:04 PM  [facility] [place] [address]Phone: [phone]  Fax:  [phone] Name: MDominigue GellnerMRN: [id number] of  Birth: [birth year]

## 2016-05-11 ENCOUNTER — Encounter: Payer: Self-pay | Admitting: Family Medicine

## 2016-05-11 ENCOUNTER — Ambulatory Visit (INDEPENDENT_AMBULATORY_CARE_PROVIDER_SITE_OTHER): Payer: BLUE CROSS/BLUE SHIELD | Admitting: Family Medicine

## 2016-05-11 DIAGNOSIS — F17200 Nicotine dependence, unspecified, uncomplicated: Secondary | ICD-10-CM

## 2016-05-11 DIAGNOSIS — Z23 Encounter for immunization: Secondary | ICD-10-CM | POA: Diagnosis not present

## 2016-05-11 NOTE — Patient Instructions (Addendum)
IF you received an x-ray today, you will receive an invoice from [facility]. Please contact [facility] at [phone] with questions or concerns regarding your invoice.   IF you received labwork today, you will receive an invoice from [facility]. Please contact [facility] at [phone] with questions or concerns regarding your invoice.   Our billing staff will not be able to assist you with questions regarding bills from these companies.  You will be contacted with the lab results as soon as they are available. The fastest way to get your results is to activate your My Chart account. Instructions are located on the last page of this paperwork. If you have not heard from [place] regarding the results in 2 weeks, please contact this office.      Tobacco Use Disorder Tobacco use disorder (TUD) is a mental disorder. It is the long-term use of tobacco in spite of related health problems or difficulty with normal life activities. Tobacco is most commonly smoked as cigarettes and less commonly as cigars or pipes. Smokeless chewing tobacco and snuff are also popular. People with TUD get a feeling of extreme pleasure (euphoria) from using tobacco and have a desire to use it again and again. Repeated use of tobacco can cause problems. The addictive effects of tobacco are due mainly tothe ingredient nicotine. Nicotine also causes a rush of adrenaline (epinephrine) in the body. This leads to increased blood pressure, heart rate, and breathing rate. These changes may cause problems for people with high blood pressure, weak hearts, or lung disease. High doses of nicotine in children and pets can lead to seizures and death. Tobacco contains a number of other unsafe chemicals. These chemicals are especially harmful when inhaled as smoke and can damage almost every organ in the body. Smokers live shorter lives than nonsmokers and are at risk of dying from a number of diseases and cancers. Tobacco  smoke can also cause health problems for nonsmokers (due to inhaling secondhand smoke). Smoking is also a fire hazard. TUD usually starts in the late teenage years and is most common in young adults between the ages of 34 and 88 years. People who start smoking earlier in life are more likely to continue smoking as adults. TUD is somewhat more common in men than women. People with TUD are at higher risk for using alcohol and other drugs of abuse. What increases the risk? Risk factors for TUD include:  Having family members with the disorder.  Being around people who use tobacco.  Having an existing mental health issue such as schizophrenia, depression, bipolar disorder, ADHD, or posttraumatic stress disorder (PTSD). What are the signs or symptoms? People with tobacco use disorder have two or more of the following signs and symptoms within 12 months:  Use of more tobacco over a longer period than intended.  Not able to cut down or control tobacco use.  A lot of time spent obtaining or using tobacco.  Strong desire or urge to use tobacco (craving). Cravings may last for 6 months or longer after quitting.  Use of tobacco even when use leads to major problems at work, school, or home.  Use of tobacco even when use leads to relationship problems.  Giving up or cutting down on important life activities because of tobacco use.  Repeatedly using tobacco in situations where it puts you or others in physical danger, like smoking in bed.  Use of tobacco even when it is known that a physical or mental problem  is likely related to tobacco use.  Physical problems are numerous and may include chronic bronchitis, emphysema, lung and other cancers, gum disease, high blood pressure, heart disease, and stroke.  Mental problems caused by tobacco may include difficulty sleeping and anxiety.  Need to use greater amounts of tobacco to get the same effect. This means you have developed a  tolerance.  Withdrawal symptoms as a result of stopping or rapidly cutting back use. These symptoms may last a month or more after quitting and include the following:  Depressed, anxious, or irritable mood.  Difficulty concentrating.  Increased appetite.  Restlessness or trouble sleeping.  Use of tobacco to avoid withdrawal symptoms. How is this diagnosed? Tobacco use disorder is diagnosed by your health care provider. A diagnosis may be made by:  Your health care provider asking questions about your tobacco use and any problems it may be causing.  A physical exam.  Lab tests.  You may be referred to a mental health professional or addiction specialist. The severity of tobacco use disorder depends on the number of signs and symptoms you have:  Mild-Two or three symptoms.  Moderate-Four or five symptoms.  Severe-Six or more symptoms. How is this treated? Many people with tobacco use disorder are unable to quit on their own and need help. Treatment options include the following:  Nicotine replacement therapy (NRT). NRT provides nicotine without the other harmful chemicals in tobacco. NRT gradually lowers the dosage of nicotine in the body and reduces withdrawal symptoms. NRT is available in over-the-counter forms (gum, lozenges, and skin patches) as well as prescription forms (mouth inhaler and nasal spray).  Medicines.This may include:  Antidepressant medicine that may reduce nicotine cravings.  A medicine that acts on nicotine receptors in the brain to reduce cravings and withdrawal symptoms. It may also block the effects of tobacco in people with TUD who relapse.  Counseling or talk therapy. A form of talk therapy called behavioral therapy is commonly used to treat people with TUD. Behavioral therapy looks at triggers for tobacco use, how to avoid them, and how to cope with cravings. It is most effective in person or by phone but is also available in self-help forms (books  and Internet websites).  Support groups. These provide emotional support, advice, and guidance for quitting tobacco. The most effective treatment for TUD is usually a combination of medicine, talk therapy, and support groups. Follow these instructions at home:  Keep all follow-up visits as directed by your health care provider. This is important.  Take medicines only as directed by your health care provider.  Check with your health care provider before starting new prescription or over-the-counter medicines. Contact a health care provider if:  You are not able to take your medicines as prescribed.  Treatment is not helping your TUD and your symptoms get worse. Get help right away if:  You have serious thoughts about hurting yourself or others.  You have trouble breathing, chest pain, sudden weakness, or sudden numbness in part of your body. This information is not intended to replace advice given to you by your health care provider. Make sure you discuss any questions you have with your health care provider. Document Released: 10/07/2003 Document Revised: 10/04/2015 Document Reviewed: 03/29/2013 Elsevier Interactive Patient Education  2017 [employer].

## 2016-05-11 NOTE — Progress Notes (Signed)
Chief Complaint  Patient presents with  . Follow-up    HPI  Tobacco Use Pt reports that she quit for 4 days a week ago and then went back to smoking a black and mild cigar She reports that she reports that she quit smoking the 8 cigarettes a day she was smoking before She is not wheezing or having sob  Morbid obesity She reports that she has been going to the gym for exercise in addition to physical therapy for low back pain and sciatica She reports that her weight has not worked despite exercising 3x weekly She is cutting back on calories and is increasing vegetables She reports that she is building more muscle  She reports that she thinks the alcohol consumption holds her back She drinks wine and tequila Wt Readings from Last 3 Encounters:  05/11/16 280 lb (127 kg)  03/09/16 280 lb (127 kg)  06/25/13 268 lb (121.6 kg)       Past Medical History:  Diagnosis Date  . Anxiety   . Depression     Current Outpatient Prescriptions  Medication Sig Dispense Refill  . albuterol (VENTOLIN HFA) 108 (90 Base) MCG/ACT inhaler 2 puffs as needed    . HYDROcodone-acetaminophen (NORCO/VICODIN) 5-325 MG tablet Take 1 tablet by mouth every 6 (six) hours as needed for moderate pain.    . meloxicam (MOBIC) 15 MG tablet 1 tablet    . sertraline (ZOLOFT) 100 MG tablet 1 tablet     No current facility-administered medications for this visit.     Allergies:  Allergies  Allergen Reactions  . Bee Venom Swelling    Other reaction(s): itching /swelling  . Oxycodone Hcl Itching    Other reaction(s): itching  . Sulfa Antibiotics     Other reaction(s): itching Itching, hives    Past Surgical History:  Procedure Laterality Date  . CYST REMOVAL NECK    . KNEE SURGERY Right     Social History   Social History  . Marital status: Single    Spouse name: N/A  . Number of children: N/A  . Years of education: N/A   Social History Main Topics  . Smoking status: Light Tobacco Smoker   Types: Cigarettes  . Smokeless tobacco: Never Used     Comment: socical   . Alcohol use 12.0 oz/week    15 Glasses of wine, 5 Shots of liquor per week  . Drug use: Yes    Types: Marijuana     Comment: used on a weekly basis  . Sexual activity: Yes   Other Topics Concern  . None   Social History Narrative  . None    ROS  Objective: Vitals:   05/11/16 1549  BP: 122/76  Pulse: 89  Resp: 16  Temp: 98.4 F (36.9 C)  TempSrc: Oral  SpO2: 95%  Weight: 280 lb (127 kg)  Height: 5\' 9"  (1.753 m)    Physical Exam  Constitutional: She is oriented to person, place, and time. She appears well-developed and well-nourished.  HENT:  Head: Normocephalic and atraumatic.  Eyes: Conjunctivae and EOM are normal.  Cardiovascular: Normal rate, regular rhythm and normal heart sounds.   Pulmonary/Chest: Effort normal and breath sounds normal. No respiratory distress. She has no wheezes.  Neurological: She is alert and oriented to person, place, and time.    Assessment and Plan Sydney Beck was seen today for follow-up.  Diagnoses and all orders for this visit:  Morbid obesity ([place])-  Advised decreasing calories especially from wine  Tobacco use disorder- discussed smoking cessation She should try counseling Changing the habit of drinking her wine with a cigar  Other orders -     Tdap vaccine greater than or equal to 7yo IM   A total of 30 minutes were spent face-to-face with the patient during this encounter and over half of that time was spent on counseling and coordination of care.   [place]

## 2016-05-12 ENCOUNTER — Ambulatory Visit: Payer: BLUE CROSS/BLUE SHIELD | Admitting: Physical Therapy

## 2016-05-25 ENCOUNTER — Telehealth: Payer: Self-pay | Admitting: General Practice

## 2016-05-25 NOTE — Telephone Encounter (Signed)
Pt advised she will need to return to the clinic for evaluation' Transferred to schedulers

## 2016-05-25 NOTE — Telephone Encounter (Signed)
Pt states that she is calling dr Nolon Rod to ask for a refill on an antibodic that she takes when she develops a boil and she has discussed this with Dr. Nolon Rod   Best number [phone]

## 2016-05-26 ENCOUNTER — Ambulatory Visit (INDEPENDENT_AMBULATORY_CARE_PROVIDER_SITE_OTHER): Payer: BLUE CROSS/BLUE SHIELD | Admitting: Physician Assistant

## 2016-05-26 VITALS — BP 120/86 | HR 93 | Temp 99.4°F | Resp 17 | Ht 69.0 in | Wt 278.0 lb

## 2016-05-26 DIAGNOSIS — L0231 Cutaneous abscess of buttock: Secondary | ICD-10-CM | POA: Diagnosis not present

## 2016-05-26 DIAGNOSIS — L0291 Cutaneous abscess, unspecified: Secondary | ICD-10-CM

## 2016-05-26 MED ORDER — HYDROCODONE-ACETAMINOPHEN 5-325 MG PO TABS: 1.0000 | ORAL_TABLET | Freq: Four times a day (QID) | ORAL | 0 refills | Status: DC | PRN

## 2016-05-26 MED ORDER — DOXYCYCLINE HYCLATE 100 MG PO CAPS: 100.0000 mg | ORAL_CAPSULE | Freq: Two times a day (BID) | ORAL | 0 refills | Status: AC

## 2016-05-26 NOTE — Patient Instructions (Addendum)
Please take the anitibiotic as prescribed.  Return to clinic for recheck in 2 days.     Incision and Drainage, Care After Refer to this sheet in the next few weeks. These instructions provide you with information about caring for yourself after your procedure. Your health care provider may also give you more specific instructions. Your treatment has been planned according to current medical practices, but problems sometimes occur. Call your health care provider if you have any problems or questions after your procedure. What can I expect after the procedure? After the procedure, it is common to have:  Pain or discomfort around your incision site.  Drainage from your incision. Follow these instructions at home:  Take over-the-counter and prescription medicines only as told by your health care provider.  If you were prescribed an antibiotic medicine, take it as told by your health care provider.Do not stop taking the antibiotic even if you start to feel better.  Followinstructions from your health care provider about:  How to take care of your incision.  When and how you should change your packing and bandage (dressing). Wash your hands with soap and water before you change your dressing. If soap and water are not available, use hand sanitizer.  When you should remove your dressing.  Do not take baths, swim, or use a hot tub until your health care provider approves.  Keep all follow-up visits as told by your health care provider. This is important.  Check your incision area every day for signs of infection. Check for:  More redness, swelling, or pain.  More fluid or blood.  Warmth.  Pus or a bad smell. Contact a health care provider if:  Your cyst or abscess returns.  You have a fever.  You have more redness, swelling, or pain around your incision.  You have more fluid or blood coming from your incision.  Your incision feels warm to the touch.  You have pus or a bad  smell coming from your incision. Get help right away if:  You have severe pain or bleeding.  You cannot eat or drink without vomiting.  You have decreased urine output.  You become short of breath.  You have chest pain.  You cough up blood.  The area where the incision and drainage occurred becomes numb or it tingles. This information is not intended to replace advice given to you by your health care provider. Make sure you discuss any questions you have with your health care provider. Document Released: 04/25/2011 Document Revised: 07/03/2015 Document Reviewed: 11/21/2014 Elsevier Interactive Patient Education  2017 [employer].    IF you received an x-ray today, you will receive an invoice from [facility]. Please contact [facility] at [phone] with questions or concerns regarding your invoice.   IF you received labwork today, you will receive an invoice from [facility]. Please contact [facility] at [phone] with questions or concerns regarding your invoice.   Our billing staff will not be able to assist you with questions regarding bills from these companies.  You will be contacted with the lab results as soon as they are available. The fastest way to get your results is to activate your My Chart account. Instructions are located on the last page of this paperwork. If you have not heard from [place] regarding the results in 2 weeks, please contact this office.

## 2016-05-26 NOTE — Progress Notes (Signed)
PRIMARY CARE AT [place], [place] [address] [phone]  Date:  05/26/2016   Name:  Sydney Beck   DOB:  Aug 05, 1975   MRN:  [id number]  PCP:  Default, Provider, MD    History of Present Illness:  Sydney Beck is a 41 y.o. female patient who presents to PCP with  Chief Complaint  Patient presents with  . Recurrent Skin Infections    In rectal area onset 4-5 days     Started having pain for the alst 405 days at her buttock.  She was traveling in [place] [address] and noticed this worsening swelling and pain.  She has noted no drainage.   She has had fever and chills, subjectively.  She felt that she was in great deal of pan.    Patient Active Problem List   Diagnosis Date Noted  . Acute stress disorder 03/09/2016  . Allergic rhinitis 03/09/2016  . Moderate major depression, single episode ([place]) 03/09/2016  . Eczema of hand 01/20/2016    Past Medical History:  Diagnosis Date  . Anxiety   . Depression     Past Surgical History:  Procedure Laterality Date  . CYST REMOVAL NECK    . KNEE SURGERY Right     Social History  Substance Use Topics  . Smoking status: Light Tobacco Smoker    Types: Cigarettes  . Smokeless tobacco: Never Used     Comment: socical   . Alcohol use 12.0 oz/week    15 Glasses of wine, 5 Shots of liquor per week    Family History  Problem Relation Age of Onset  . Diabetes Father   . Depression Father   . Anxiety disorder Father   . Dementia Father   . Anxiety disorder Mother   . Depression Mother   . Schizophrenia Brother   . Alcohol abuse Brother   . Drug abuse Brother   . Depression Brother   . Anxiety disorder Brother     Allergies  Allergen Reactions  . Bee Venom Swelling    Other reaction(s): itching /swelling  . Oxycodone Hcl Itching    Other reaction(s): itching  . Sulfa Antibiotics     Other reaction(s): itching Itching, hives    Medication list has been reviewed and updated.  Current Outpatient  Prescriptions on File Prior to Visit  Medication Sig Dispense Refill  . albuterol (VENTOLIN HFA) 108 (90 Base) MCG/ACT inhaler 2 puffs as needed    . HYDROcodone-acetaminophen (NORCO/VICODIN) 5-325 MG tablet Take 1 tablet by mouth every 6 (six) hours as needed for moderate pain.    . meloxicam (MOBIC) 15 MG tablet 1 tablet    . sertraline (ZOLOFT) 100 MG tablet 1 tablet     No current facility-administered medications on file prior to visit.     ROS ROS otherwise unremarkable unless listed above.  Physical Examination: BP 120/86 (BP Location: Right Arm, Patient Position: Sitting, Cuff Size: Large)   Pulse 93   Temp 99.4 F (37.4 C) (Oral)   Resp 17   Ht 5\' 9"  (1.753 m)   Wt 278 lb (126.1 kg)   LMP 05/04/2016   SpO2 99%   BMI 41.05 kg/m  Ideal Body Weight: Weight in (lb) to have BMI = 25: 168.9  Physical Exam  Constitutional: She is oriented to person, place, and time. She appears well-developed and well-nourished. No distress.  HENT:  Head: Normocephalic and atraumatic.  Right Ear: External ear normal.  Left Ear: External ear normal.  Eyes:  Conjunctivae and EOM are normal. Pupils are equal, round, and reactive to light.  Cardiovascular: Normal rate.   Pulmonary/Chest: Effort normal. No respiratory distress.  Genitourinary:  Genitourinary Comments: Left buttock more proximal to the gluteal cleft with draining abscess with sinus tracting just superior.  Tender.  No tenderness at the right buttock.  No erythema noted.    Neurological: She is alert and oriented to person, place, and time.  Skin: She is not diaphoretic.  Psychiatric: She has a normal mood and affect. Her behavior is normal.     Procedure: verbal consent obtained.  Alcohol swabbed.  1% lidocaine placed at the abscess at the left buttock that is currently draining.  11 blade utilized to widen opening.  Purulent drainage generously pouring.  Searched for loculations.  3 cm sinus tract superiorly.  Irrigated with  normal saline, once drainage became minimal.  1/4 packing placed.  Dressing applied.  Assessment and Plan: Sydney Beck is a 41 y.o. female who is here today for cc of abscess.   Abscess of buttock, left  Abscess - Plan: WOUND CULTURE, doxycycline (VIBRAMYCIN) 100 MG capsule, HYDROcodone-acetaminophen (NORCO) 5-325 MG tablet  Morbid obesity ([place])  Ivar Drape, PA-C [facility] 4/22/20188:45 PM

## 2016-05-27 ENCOUNTER — Telehealth: Payer: Self-pay | Admitting: Physician Assistant

## 2016-05-27 NOTE — Telephone Encounter (Signed)
Pt is needing to know if she needs to come back in for wound care or can she take the packing out her self   Best number [phone]

## 2016-05-29 LAB — WOUND CULTURE

## 2016-05-30 NOTE — Telephone Encounter (Signed)
Pt will return to the clinic tomorrow. States, the packing came out yesterday.  Advised to return for recheck

## 2016-05-30 NOTE — Telephone Encounter (Signed)
Attempted to call.  Went to [facility] and full.  As she was advised by Ledell Noss, and on her AVS summary, she was to return here in 2 days for recheck of her wound.  She should have returned on Saturday to see [place].  Please advise her to return ASAP

## 2016-05-31 ENCOUNTER — Encounter: Payer: Self-pay | Admitting: Physician Assistant

## 2016-05-31 ENCOUNTER — Ambulatory Visit (INDEPENDENT_AMBULATORY_CARE_PROVIDER_SITE_OTHER): Payer: BLUE CROSS/BLUE SHIELD | Admitting: Physician Assistant

## 2016-05-31 VITALS — Temp 98.4°F

## 2016-05-31 DIAGNOSIS — L089 Local infection of the skin and subcutaneous tissue, unspecified: Secondary | ICD-10-CM

## 2016-05-31 DIAGNOSIS — L0293 Carbuncle, unspecified: Secondary | ICD-10-CM

## 2016-05-31 MED ORDER — OMEPRAZOLE 20 MG PO CPDR
20.0000 mg | DELAYED_RELEASE_CAPSULE | Freq: Every day | ORAL | 0 refills | Status: DC
Start: 2016-05-31 — End: 2017-05-23

## 2016-05-31 NOTE — Patient Instructions (Addendum)
Start taking doxycycline once per day.  Wash with soap and water twice per day.  Keep the area covered during the day, but you can have it uncovered at night. Please await contact for dermatology consult.  This is healing well.  Return to the clinic as needed. Hidradenitis Suppurativa Hidradenitis suppurativa is a long-term (chronic) skin disease that starts with blocked sweat glands or hair follicles. Bacteria may grow in these blocked openings of your skin. Hidradenitis suppurativa is like a severe form of acne that develops in areas of your body where acne would be unusual. It is most likely to affect the areas of your body where skin rubs against skin and becomes moist. This includes your:  Underarms.  Groin.  Genital areas.  Buttocks.  Upper thighs.  Breasts. Hidradenitis suppurativa may start out with small pimples. The pimples can develop into deep sores that break open (rupture) and drain pus. Over time your skin may thicken and become scarred. Hidradenitis suppurativa cannot be passed from person to person. What are the causes? The exact cause of hidradenitis suppurativa is not known. This condition may be due to:  Female and female hormones. The condition is rare before and after puberty.  An overactive body defense system (immune system). Your immune system may overreact to the blocked hair follicles or sweat glands and cause swelling and pus-filled sores. What increases the risk? You may have a higher risk of hidradenitis suppurativa if you:  Are a woman.  Are between ages 84 and 1.  Have a family history of hidradenitis suppurativa.  Have a personal history of acne.  Are overweight.  Smoke.  Take the drug lithium. What are the signs or symptoms? The first signs of an outbreak are usually painful skin bumps that look like pimples. As the condition progresses:  Skin bumps may get bigger and grow deeper into the skin.  Bumps under the skin may rupture and drain  smelly pus.  Skin may become itchy and infected.  Skin may thicken and scar.  Drainage may continue through tunnels under the skin (fistulas).  Walking and moving your arms can become painful. How is this diagnosed? Your health care provider may diagnose hidradenitis suppurativa based on your medical history and your signs and symptoms. A physical exam will also be done. You may need to see a health care provider who specializes in skin diseases (dermatologist). You may also have tests done to confirm the diagnosis. These can include:  Swabbing a sample of pus or drainage from your skin so it can be sent to the lab and tested for infection.  Blood tests to check for infection. How is this treated? The same treatment will not work for everybody with hidradenitis suppurativa. Your treatment will depend on how severe your symptoms are. You may need to try several treatments to find what works best for you. Part of your treatment may include cleaning and bandaging (dressing) your wounds. You may also have to take medicines, such as the following:  Antibiotics.  Acne medicines.  Medicines to block or suppress the immune system.  A diabetes medicine (metformin) is sometimes used to treat this condition.  For women, birth control pills can sometimes help relieve symptoms. You may need surgery if you have a severe case of hidradenitis suppurativa that does not respond to medicine. Surgery may involve:  Using a laser to clear the skin and remove hair follicles.  Opening and draining deep sores.  Removing the areas of skin that are  diseased and scarred. Follow these instructions at home:  Learn as much as you can about your disease, and work closely with your health care providers.  Take medicines only as directed by your health care provider.  If you were prescribed an antibiotic medicine, finish it all even if you start to feel better.  If you are overweight, losing weight may be  very helpful. Try to reach and maintain a healthy weight.  Do not use any tobacco products, including cigarettes, chewing tobacco, or electronic cigarettes. If you need help quitting, ask your health care provider.  Do not shave the areas where you get hidradenitis suppurativa.  Do not wear deodorant.  Wear loose-fitting clothes.  Try not to overheat and get sweaty.  Take a daily bleach bath as directed by your health care provider.  Fill your bathtub halfway with water.  Pour in  cup of unscented household bleach.  Soak for 5-10 minutes.  Cover sore areas with a warm, clean washcloth (compress) for 5-10 minutes. Contact a health care provider if:  You have a flare-up of hidradenitis suppurativa.  You have chills or a fever.  You are having trouble controlling your symptoms at home. This information is not intended to replace advice given to you by your health care provider. Make sure you discuss any questions you have with your health care provider. Document Released: 09/15/2003 Document Revised: 07/09/2015 Document Reviewed: 05/03/2013 Elsevier Interactive Patient Education  2017 [employer].

## 2016-05-31 NOTE — Progress Notes (Signed)
PRIMARY CARE AT [place], [place] [address] [phone]  Date:  05/31/2016   Name:  Sydney Beck   DOB:  08-24-1975   MRN:  [id number]  PCP:  Default, Provider, MD    History of Present Illness:  Sydney Beck is a 41 y.o. female patient who presents to PCP with cc of wound caree of abscess at buttocks.   Patient was seen here 5 days ago where there was an already draining abscess at the buttocks of purulent fluid.  Incision and drainage was performed, and placed on doxycycline.  Advised to follow up in 2 days, but she did not.  She returns day 5.  She reports that there was heavy drainage the 1st day of her symptoms.  She removed the packing herself and replaced with a covering.  No fever, drainage since that time.  Changing dressing daily.  Patient Active Problem List   Diagnosis Date Noted  . Acute stress disorder 03/09/2016  . Allergic rhinitis 03/09/2016  . Moderate major depression, single episode ([place]) 03/09/2016  . Eczema of hand 01/20/2016    Past Medical History:  Diagnosis Date  . Anxiety   . Depression     Past Surgical History:  Procedure Laterality Date  . CYST REMOVAL NECK    . KNEE SURGERY Right     Social History  Substance Use Topics  . Smoking status: Light Tobacco Smoker    Types: Cigarettes  . Smokeless tobacco: Never Used     Comment: socical   . Alcohol use 12.0 oz/week    15 Glasses of wine, 5 Shots of liquor per week    Family History  Problem Relation Age of Onset  . Diabetes Father   . Depression Father   . Anxiety disorder Father   . Dementia Father   . Anxiety disorder Mother   . Depression Mother   . Schizophrenia Brother   . Alcohol abuse Brother   . Drug abuse Brother   . Depression Brother   . Anxiety disorder Brother     Allergies  Allergen Reactions  . Bee Venom Swelling    Other reaction(s): itching /swelling  . Oxycodone Hcl Itching    Other reaction(s): itching  . Sulfa Antibiotics     Other  reaction(s): itching Itching, hives    Medication list has been reviewed and updated.  Current Outpatient Prescriptions on File Prior to Visit  Medication Sig Dispense Refill  . albuterol (VENTOLIN HFA) 108 (90 Base) MCG/ACT inhaler 2 puffs as needed    . doxycycline (VIBRAMYCIN) 100 MG capsule Take 1 capsule (100 mg total) by mouth 2 (two) times daily. 20 capsule 0  . HYDROcodone-acetaminophen (NORCO) 5-325 MG tablet Take 1 tablet by mouth every 6 (six) hours as needed. 20 tablet 0  . meloxicam (MOBIC) 15 MG tablet 1 tablet    . sertraline (ZOLOFT) 100 MG tablet 1 tablet     No current facility-administered medications on file prior to visit.     ROS ROS otherwise unremarkable unless listed above.  Physical Examination: Temp 98.4 F (36.9 C) (Oral)   LMP 05/04/2016  Ideal Body Weight:    Physical Exam  Constitutional: She is oriented to person, place, and time. She appears well-developed and well-nourished. No distress.  HENT:  Head: Normocephalic and atraumatic.  Right Ear: External ear normal.  Left Ear: External ear normal.  Eyes: Conjunctivae and EOM are normal. Pupils are equal, round, and reactive to light.  Cardiovascular: Normal rate.  Pulmonary/Chest: Effort normal. No respiratory distress.  Neurological: She is alert and oriented to person, place, and time.  Skin: She is not diaphoretic.  Left buttock without sanguinous tissue without purulence.  No fluctuance or opening.   Scarring and hyperpigmentation.  There is scarring consistent with past lesions.  Open comedones.    Psychiatric: She has a normal mood and affect. Her behavior is normal.     Assessment and Plan: Sydney Beck is a 41 y.o. female who is here today for wound care of abscess status post drainage. Referral given for recurrent lesions.  Likely hidradenitis suppurativa.   Culture was enterococcus, however is healing well.  Likely placement.  Advised that we can now switch to once per day of  the doxycycline at this time.  Referral for dermatology given today. Skin infection - Plan: Ambulatory referral to Dermatology  Recurrent boils - Plan: Ambulatory referral to Dermatology  Ivar Drape, PA-C [facility] 4/17/20189:06 PM

## 2016-06-02 DIAGNOSIS — F4310 Post-traumatic stress disorder, unspecified: Secondary | ICD-10-CM | POA: Diagnosis not present

## 2016-06-02 DIAGNOSIS — F331 Major depressive disorder, recurrent, moderate: Secondary | ICD-10-CM | POA: Diagnosis not present

## 2016-06-09 DIAGNOSIS — F331 Major depressive disorder, recurrent, moderate: Secondary | ICD-10-CM | POA: Diagnosis not present

## 2016-06-16 DIAGNOSIS — F4310 Post-traumatic stress disorder, unspecified: Secondary | ICD-10-CM | POA: Diagnosis not present

## 2016-06-16 DIAGNOSIS — F331 Major depressive disorder, recurrent, moderate: Secondary | ICD-10-CM | POA: Diagnosis not present

## 2016-06-20 ENCOUNTER — Encounter: Payer: Self-pay | Admitting: Physician Assistant

## 2016-06-20 ENCOUNTER — Ambulatory Visit (INDEPENDENT_AMBULATORY_CARE_PROVIDER_SITE_OTHER): Payer: BLUE CROSS/BLUE SHIELD | Admitting: Physician Assistant

## 2016-06-20 VITALS — BP 117/82 | HR 87 | Temp 98.6°F | Resp 17 | Ht 69.0 in | Wt 271.0 lb

## 2016-06-20 DIAGNOSIS — J45909 Unspecified asthma, uncomplicated: Secondary | ICD-10-CM | POA: Diagnosis not present

## 2016-06-20 DIAGNOSIS — R070 Pain in throat: Secondary | ICD-10-CM | POA: Diagnosis not present

## 2016-06-20 LAB — POCT RAPID STREP A (OFFICE): RAPID STREP A SCREEN: NEGATIVE

## 2016-06-20 MED ORDER — CETIRIZINE HCL 10 MG PO TABS: 10.0000 mg | ORAL_TABLET | Freq: Every day | ORAL | 11 refills | Status: DC

## 2016-06-20 MED ORDER — PREDNISONE 20 MG PO TABS: ORAL_TABLET | ORAL | 0 refills | Status: DC

## 2016-06-20 MED ORDER — GUAIFENESIN ER 1200 MG PO TB12: 1.0000 | ORAL_TABLET | Freq: Two times a day (BID) | ORAL | 1 refills | Status: DC | PRN

## 2016-06-20 MED ORDER — ALBUTEROL SULFATE HFA 108 (90 BASE) MCG/ACT IN AERS: 2.0000 | INHALATION_SPRAY | RESPIRATORY_TRACT | 1 refills | Status: DC | PRN

## 2016-06-20 NOTE — Patient Instructions (Addendum)
You can perform the albuterol inhaler every 6 hours for the next 24 hours, then resume an as needed basis.  This will help with the wheezing. Start the prednisone taper tomorrow morning, so it is not hard to sleep tonight. Take tylenol or ibuprofen for pain Take the zyrtec, and the claritin nasal inhaler.   Also do the mucinex, but you must hydrate well on this medicine.   Bronchospasm, Adult Bronchospasm is when airways in the lungs get smaller. When this happens, it can be hard to breathe. You may cough. You may also make a whistling sound when you breathe (wheeze). Follow these instructions at home: Medicines   Take over-the-counter and prescription medicines only as told by your doctor.  If you need to use an inhaler or nebulizer to take your medicine, ask your doctor how to use it.  If you were given a spacer, always use it with your inhaler. Lifestyle   Change your heating and air conditioning filter. Do this at least once a month.  Try not to use fireplaces and wood stoves.  Do not  smoke. Do not  allow smoking in your home.  Try not to use things that have a strong smell, like perfume.  Get rid of pests (such as roaches and mice) and their poop.  Remove any mold from your home.  Keep your house clean. Get rid of dust.  Use cleaning products that have no smell.  Replace carpet with wood, tile, or vinyl flooring.  Use allergy-proof pillows, mattress covers, and box spring covers.  Wash bed sheets and blankets every week. Use hot water. Dry them in a dryer.  Use blankets that are made of polyester or cotton.  Wash your hands often.  Keep pets out of your bedroom.  When you exercise, try not to breathe in cold air. General instructions   Have a plan for getting medical care. Know these things:  When to call your doctor.  When to call local emergency services (911 in the [place]  Where to go in an emergency.  Stay up to date on your shots  (immunizations).  When you have an episode:  Stay calm.  Relax.  Breathe slowly. Contact a doctor if:  Your muscles ache.  Your chest hurts.  The color of the mucus you cough up (sputum) changes from clear or white to yellow, green, gray, or bloody.  The mucus you cough up gets thicker.  You have a fever. Get help right away if:  The whistling sound gets worse, even after you take your medicines.  Your coughing gets worse.  You find it even harder to breathe.  Your chest hurts very much. Summary  Bronchospasm is when airways in the lungs get smaller.  When this happens, it can be hard to breathe. You may cough. You may also make a whistling sound when you breathe.  Stay away from things that cause you to have episodes. These include smoke or dust. This information is not intended to replace advice given to you by your health care provider. Make sure you discuss any questions you have with your health care provider. Document Released: 11/28/2008 Document Revised: 02/04/2016 Document Reviewed: 02/04/2016 Elsevier Interactive Patient Education  2017 [employer].     IF you received an x-ray today, you will receive an invoice from [facility]. Please contact [facility] at [phone] with questions or concerns regarding your invoice.   IF you received labwork today, you will receive an invoice from [employer] [facility].  Please contact [facility] at [phone] with questions or concerns regarding your invoice.   Our billing staff will not be able to assist you with questions regarding bills from these companies.  You will be contacted with the lab results as soon as they are available. The fastest way to get your results is to activate your My Chart account. Instructions are located on the last page of this paperwork. If you have not heard from [place] regarding the results in 2 weeks, please contact this office.

## 2016-06-20 NOTE — Progress Notes (Signed)
PRIMARY CARE AT [place], [place] [address] [phone]  Date:  06/20/2016   Name:  Sydney Beck   DOB:  1975/11/28   MRN:  [id number]  PCP:  Default, Provider, MD    History of Present Illness:  Sydney Beck is a 41 y.o. female patient who presents to PCP with  Chief Complaint  Patient presents with  . Sore Throat    onset 4 days  . Hoarse  . Sinusitis     Sneezing and runny nose for 4 days.  Post nasal drip, and hoarseness, and dryness.   She is currently taking silver, throat spray, and alka seltzer cold.  She is taking generic brand of allergies, and tea with honey.  This is helping.  Black and mild smoked yesterday, and outdoors, she felt her symptoms worsen.  Watery eyes.  She feels that she wheezing, but not great fatigue.   There is some congestion. She is using a claritin nasal spray which is helping.    Patient Active Problem List   Diagnosis Date Noted  . Acute stress disorder 03/09/2016  . Allergic rhinitis 03/09/2016  . Moderate major depression, single episode ([place]) 03/09/2016  . Eczema of hand 01/20/2016    Past Medical History:  Diagnosis Date  . Anxiety   . Depression     Past Surgical History:  Procedure Laterality Date  . CYST REMOVAL NECK    . KNEE SURGERY Right     Social History  Substance Use Topics  . Smoking status: Light Tobacco Smoker    Types: Cigarettes  . Smokeless tobacco: Never Used     Comment: socical   . Alcohol use 12.0 oz/week    15 Glasses of wine, 5 Shots of liquor per week    Family History  Problem Relation Age of Onset  . Diabetes Father   . Depression Father   . Anxiety disorder Father   . Dementia Father   . Anxiety disorder Mother   . Depression Mother   . Schizophrenia Brother   . Alcohol abuse Brother   . Drug abuse Brother   . Depression Brother   . Anxiety disorder Brother     Allergies  Allergen Reactions  . Bee Venom Swelling    Other reaction(s): itching /swelling  .  Oxycodone Hcl Itching    Other reaction(s): itching  . Sulfa Antibiotics     Other reaction(s): itching Itching, hives    Medication list has been reviewed and updated.  Current Outpatient Prescriptions on File Prior to Visit  Medication Sig Dispense Refill  . albuterol (VENTOLIN HFA) 108 (90 Base) MCG/ACT inhaler 2 puffs as needed    . meloxicam (MOBIC) 15 MG tablet 1 tablet    . HYDROcodone-acetaminophen (NORCO) 5-325 MG tablet Take 1 tablet by mouth every 6 (six) hours as needed. (Patient not taking: Reported on 06/20/2016) 20 tablet 0  . omeprazole (PRILOSEC) 20 MG capsule Take 1 capsule (20 mg total) by mouth daily. (Patient not taking: Reported on 06/20/2016) 30 capsule 0   No current facility-administered medications on file prior to visit.     ROS ROS otherwise unremarkable unless listed above.  Physical Examination: BP 117/82 (BP Location: Right Arm, Patient Position: Sitting, Cuff Size: Large)   Pulse 87   Temp 98.6 F (37 C) (Oral)   Resp 17   Ht 5\' 9"  (1.753 m)   Wt 271 lb (122.9 kg)   LMP 05/29/2016   SpO2 98%   BMI 40.02  kg/m  Ideal Body Weight: Weight in (lb) to have BMI = 25: 168.9  Physical Exam  Constitutional: She is oriented to person, place, and time. She appears well-developed and well-nourished. No distress.  HENT:  Head: Normocephalic and atraumatic.  Right Ear: Tympanic membrane, external ear and ear canal normal.  Left Ear: Tympanic membrane, external ear and ear canal normal.  Nose: Mucosal edema and rhinorrhea present. Right sinus exhibits no maxillary sinus tenderness and no frontal sinus tenderness. Left sinus exhibits no maxillary sinus tenderness and no frontal sinus tenderness.  Mouth/Throat: No uvula swelling. No oropharyngeal exudate, posterior oropharyngeal edema or posterior oropharyngeal erythema.  Eyes: Conjunctivae and EOM are normal. Pupils are equal, round, and reactive to light.  Cardiovascular: Normal rate and regular rhythm.  Exam  reveals no gallop, no distant heart sounds and no friction rub.   No murmur heard. Pulmonary/Chest: Effort normal. No respiratory distress. She has no decreased breath sounds. She has wheezes (mild expiratory wheezing). She has no rhonchi.  Lymphadenopathy:       Head (right side): No submandibular, no tonsillar, no preauricular and no posterior auricular adenopathy present.       Head (left side): No submandibular, no tonsillar, no preauricular and no posterior auricular adenopathy present.  Neurological: She is alert and oriented to person, place, and time.  Skin: She is not diaphoretic.  Psychiatric: She has a normal mood and affect. Her behavior is normal.     Assessment and Plan: Sydney Beck is a 41 y.o. female who is here today for cc of allergies. --refill of the albuterol, 2nd gen antihistamine, and short prednisone taper.   --declines in-house nebulizing treatment at this time.   --she will pick up the albuterol and perform every 6 hours for the next 24 hours. --temporary mucinex at this time. --o2 sat within normal range and vitals.  Non-acute, but warned of alarming sxs to warrant immediate return. Asthma due to seasonal allergies - Plan: cetirizine (ZYRTEC) 10 MG tablet, predniSONE (DELTASONE) 20 MG tablet, albuterol (PROVENTIL HFA;VENTOLIN HFA) 108 (90 Base) MCG/ACT inhaler, Guaifenesin (MUCINEX MAXIMUM STRENGTH) 1200 MG TB12  Throat pain - Plan: POCT rapid strep A, GC NAA, Pharyngeal, albuterol (PROVENTIL HFA;VENTOLIN HFA) 108 (90 Base) MCG/ACT inhaler  Ivar Drape, PA-C [facility] 5/9/20188:02 AM

## 2016-06-21 ENCOUNTER — Telehealth: Payer: Self-pay | Admitting: Physician Assistant

## 2016-06-21 LAB — GC NAA, PHARYNGEAL: N GONORRHOEA RRNA NPH QL PCR: NEGATIVE

## 2016-06-21 NOTE — Telephone Encounter (Signed)
Pt is needing her work note extended thru today she is still not feeling up to going to work  [employer] number [phone]

## 2016-06-21 NOTE — Telephone Encounter (Signed)
Please

## 2016-06-21 NOTE — Telephone Encounter (Signed)
Ok to extend

## 2016-06-21 NOTE — Telephone Encounter (Signed)
Done , up front for pick up

## 2016-06-23 DIAGNOSIS — F330 Major depressive disorder, recurrent, mild: Secondary | ICD-10-CM | POA: Diagnosis not present

## 2016-06-30 DIAGNOSIS — F331 Major depressive disorder, recurrent, moderate: Secondary | ICD-10-CM | POA: Diagnosis not present

## 2016-06-30 DIAGNOSIS — F4310 Post-traumatic stress disorder, unspecified: Secondary | ICD-10-CM | POA: Diagnosis not present

## 2016-07-13 ENCOUNTER — Ambulatory Visit (INDEPENDENT_AMBULATORY_CARE_PROVIDER_SITE_OTHER): Payer: BLUE CROSS/BLUE SHIELD | Admitting: Family Medicine

## 2016-07-13 ENCOUNTER — Encounter: Payer: Self-pay | Admitting: Family Medicine

## 2016-07-13 VITALS — BP 105/74 | HR 83 | Temp 98.8°F | Resp 16 | Ht 68.25 in | Wt 273.0 lb

## 2016-07-13 DIAGNOSIS — Z113 Encounter for screening for infections with a predominantly sexual mode of transmission: Secondary | ICD-10-CM

## 2016-07-13 NOTE — Progress Notes (Signed)
Chief Complaint  Patient presents with  . Follow-up    OBESITY    HPI  Morbid obesity-    Pt reports that she has some set backs with needing prednisone, acute illness and has not been able to work out consistently. She joined a sixteen week program for weight loss but due to her URI and her difficulty with exercise related to her asthma and skin abscess she was not able to go but she still practiced calorie restriction  Body mass index is 41.21 kg/m. Wt Readings from Last 3 Encounters:  07/13/16 273 lb (123.8 kg)  06/20/16 271 lb (122.9 kg)  05/26/16 278 lb (126.1 kg)   Screening for STDs Pt reports that she would like to be screened for stds She denies vaginal discharge She denies pelvic pain  Past Medical History:  Diagnosis Date  . Anxiety   . Depression     Current Outpatient Prescriptions  Medication Sig Dispense Refill  . albuterol (PROVENTIL HFA;VENTOLIN HFA) 108 (90 Base) MCG/ACT inhaler Inhale 2 puffs into the lungs every 4 (four) hours as needed for wheezing or shortness of breath (cough, shortness of breath or wheezing.). 1 Inhaler 1  . cetirizine (ZYRTEC) 10 MG tablet Take 1 tablet (10 mg total) by mouth daily. 30 tablet 11  . HYDROcodone-acetaminophen (NORCO) 5-325 MG tablet Take 1 tablet by mouth every 6 (six) hours as needed. 20 tablet 0  . meloxicam (MOBIC) 15 MG tablet 1 tablet    . omeprazole (PRILOSEC) 20 MG capsule Take 1 capsule (20 mg total) by mouth daily. (Patient not taking: Reported on 06/20/2016) 30 capsule 0   No current facility-administered medications for this visit.     Allergies:  Allergies  Allergen Reactions  . Bee Venom Swelling    Other reaction(s): itching /swelling  . Oxycodone Hcl Itching    Other reaction(s): itching  . Sulfa Antibiotics     Other reaction(s): itching Itching, hives    Past Surgical History:  Procedure Laterality Date  . CYST REMOVAL NECK    . KNEE SURGERY Right     Social History   Social  History  . Marital status: Single    Spouse name: N/A  . Number of children: N/A  . Years of education: N/A   Social History Main Topics  . Smoking status: Light Tobacco Smoker    Types: Cigarettes  . Smokeless tobacco: Never Used     Comment: socical   . Alcohol use 12.0 oz/week    15 Glasses of wine, 5 Shots of liquor per week  . Drug use: Yes    Types: Marijuana     Comment: used on a weekly basis  . Sexual activity: Yes   Other Topics Concern  . None   Social History Narrative  . None    Review of Systems  Constitutional: Negative for chills, fever and malaise/fatigue.  Gastrointestinal: Negative for abdominal pain, nausea and vomiting.    Objective: Vitals:   07/13/16 1639  BP: 105/74  Pulse: 83  Resp: 16  Temp: 98.8 F (37.1 C)  TempSrc: Oral  SpO2: 98%  Weight: 273 lb (123.8 kg)  Height: 5' 8.25" (1.734 m)    Physical Exam  Constitutional: She is oriented to person, place, and time. She appears well-developed and well-nourished.  HENT:  Head: Normocephalic and atraumatic.  Eyes: Conjunctivae and EOM are normal.  Cardiovascular: Normal rate, regular rhythm and normal heart sounds.   Pulmonary/Chest: Effort normal and breath sounds normal. No respiratory  distress.  Neurological: She is alert and oriented to person, place, and time.    Assessment and Plan Cat[place]ia was seen today for follow-up.  Diagnoses and all orders for this visit:  Screen for STD (sexually transmitted disease)- verbal consent given for std screening -     RPR -     Hepatitis B surface antibody -     HIV antibody -     Hepatitis B surface antigen -     Hepatitis B surface antigen -     Specimen status report  Morbid obesity ([place])-  Discussed weight loss strategies  A total of 25 minutes were spent face-to-face with the patient during this encounter and over half of that time was spent on counseling and coordination of care.     [place]

## 2016-07-13 NOTE — Patient Instructions (Signed)
     IF you received an x-ray today, you will receive an invoice from [facility]. Please contact [facility] at [phone] with questions or concerns regarding your invoice.   IF you received labwork today, you will receive an invoice from [facility]. Please contact [facility] at [phone] with questions or concerns regarding your invoice.   Our billing staff will not be able to assist you with questions regarding bills from these companies.  You will be contacted with the lab results as soon as they are available. The fastest way to get your results is to activate your My Chart account. Instructions are located on the last page of this paperwork. If you have not heard from us regarding the results in 2 weeks, please contact this office.     

## 2016-07-14 LAB — HIV ANTIBODY (ROUTINE TESTING W REFLEX): HIV SCREEN 4TH GENERATION: NONREACTIVE

## 2016-07-14 LAB — HEPATITIS B SURFACE ANTIBODY, QUANTITATIVE: HEPATITIS B SURF AB QUANT: 34.6 m[IU]/mL

## 2016-07-14 LAB — RPR: RPR: NONREACTIVE

## 2016-07-19 LAB — HEPATITIS B SURFACE ANTIGEN: HEP B S AG: NEGATIVE

## 2016-07-19 LAB — SPECIMEN STATUS REPORT

## 2016-07-26 DIAGNOSIS — F331 Major depressive disorder, recurrent, moderate: Secondary | ICD-10-CM | POA: Diagnosis not present

## 2016-07-26 DIAGNOSIS — F4310 Post-traumatic stress disorder, unspecified: Secondary | ICD-10-CM | POA: Diagnosis not present

## 2016-08-02 DIAGNOSIS — F331 Major depressive disorder, recurrent, moderate: Secondary | ICD-10-CM | POA: Diagnosis not present

## 2016-08-02 DIAGNOSIS — F4310 Post-traumatic stress disorder, unspecified: Secondary | ICD-10-CM | POA: Diagnosis not present

## 2016-08-10 DIAGNOSIS — L732 Hidradenitis suppurativa: Secondary | ICD-10-CM | POA: Diagnosis not present

## 2016-08-10 DIAGNOSIS — L700 Acne vulgaris: Secondary | ICD-10-CM | POA: Diagnosis not present

## 2016-08-22 DIAGNOSIS — F331 Major depressive disorder, recurrent, moderate: Secondary | ICD-10-CM | POA: Diagnosis not present

## 2016-08-30 DIAGNOSIS — F330 Major depressive disorder, recurrent, mild: Secondary | ICD-10-CM | POA: Diagnosis not present

## 2016-09-06 DIAGNOSIS — F330 Major depressive disorder, recurrent, mild: Secondary | ICD-10-CM | POA: Diagnosis not present

## 2016-09-06 DIAGNOSIS — F4310 Post-traumatic stress disorder, unspecified: Secondary | ICD-10-CM | POA: Diagnosis not present

## 2016-09-13 DIAGNOSIS — F4310 Post-traumatic stress disorder, unspecified: Secondary | ICD-10-CM | POA: Diagnosis not present

## 2016-09-13 DIAGNOSIS — F330 Major depressive disorder, recurrent, mild: Secondary | ICD-10-CM | POA: Diagnosis not present

## 2016-09-21 DIAGNOSIS — F330 Major depressive disorder, recurrent, mild: Secondary | ICD-10-CM | POA: Diagnosis not present

## 2016-09-22 DIAGNOSIS — A5901 Trichomonal vulvovaginitis: Secondary | ICD-10-CM | POA: Diagnosis not present

## 2016-09-22 DIAGNOSIS — Z1159 Encounter for screening for other viral diseases: Secondary | ICD-10-CM | POA: Diagnosis not present

## 2016-09-22 DIAGNOSIS — R350 Frequency of micturition: Secondary | ICD-10-CM | POA: Diagnosis not present

## 2016-09-22 DIAGNOSIS — Z113 Encounter for screening for infections with a predominantly sexual mode of transmission: Secondary | ICD-10-CM | POA: Diagnosis not present

## 2016-09-22 DIAGNOSIS — Z114 Encounter for screening for human immunodeficiency virus [HIV]: Secondary | ICD-10-CM | POA: Diagnosis not present

## 2016-09-22 DIAGNOSIS — Z118 Encounter for screening for other infectious and parasitic diseases: Secondary | ICD-10-CM | POA: Diagnosis not present

## 2016-09-22 DIAGNOSIS — R109 Unspecified abdominal pain: Secondary | ICD-10-CM | POA: Diagnosis not present

## 2016-09-27 DIAGNOSIS — F330 Major depressive disorder, recurrent, mild: Secondary | ICD-10-CM | POA: Diagnosis not present

## 2016-09-27 DIAGNOSIS — F4310 Post-traumatic stress disorder, unspecified: Secondary | ICD-10-CM | POA: Diagnosis not present

## 2016-10-04 DIAGNOSIS — F4310 Post-traumatic stress disorder, unspecified: Secondary | ICD-10-CM | POA: Diagnosis not present

## 2016-10-04 DIAGNOSIS — F330 Major depressive disorder, recurrent, mild: Secondary | ICD-10-CM | POA: Diagnosis not present

## 2016-10-18 DIAGNOSIS — F330 Major depressive disorder, recurrent, mild: Secondary | ICD-10-CM | POA: Diagnosis not present

## 2016-10-18 DIAGNOSIS — F4310 Post-traumatic stress disorder, unspecified: Secondary | ICD-10-CM | POA: Diagnosis not present

## 2016-10-27 DIAGNOSIS — N898 Other specified noninflammatory disorders of vagina: Secondary | ICD-10-CM | POA: Diagnosis not present

## 2016-10-29 ENCOUNTER — Other Ambulatory Visit: Payer: Self-pay | Admitting: Physician Assistant

## 2016-10-29 DIAGNOSIS — J45909 Unspecified asthma, uncomplicated: Secondary | ICD-10-CM

## 2016-11-01 DIAGNOSIS — F330 Major depressive disorder, recurrent, mild: Secondary | ICD-10-CM | POA: Diagnosis not present

## 2016-11-01 DIAGNOSIS — F4311 Post-traumatic stress disorder, acute: Secondary | ICD-10-CM | POA: Diagnosis not present

## 2016-11-08 DIAGNOSIS — F4310 Post-traumatic stress disorder, unspecified: Secondary | ICD-10-CM | POA: Diagnosis not present

## 2016-11-08 DIAGNOSIS — F330 Major depressive disorder, recurrent, mild: Secondary | ICD-10-CM | POA: Diagnosis not present

## 2016-11-08 DIAGNOSIS — F331 Major depressive disorder, recurrent, moderate: Secondary | ICD-10-CM | POA: Diagnosis not present

## 2016-11-15 DIAGNOSIS — F330 Major depressive disorder, recurrent, mild: Secondary | ICD-10-CM | POA: Diagnosis not present

## 2016-11-28 DIAGNOSIS — F4310 Post-traumatic stress disorder, unspecified: Secondary | ICD-10-CM | POA: Diagnosis not present

## 2016-11-28 DIAGNOSIS — F330 Major depressive disorder, recurrent, mild: Secondary | ICD-10-CM | POA: Diagnosis not present

## 2016-12-05 DIAGNOSIS — F4310 Post-traumatic stress disorder, unspecified: Secondary | ICD-10-CM | POA: Diagnosis not present

## 2016-12-05 DIAGNOSIS — F330 Major depressive disorder, recurrent, mild: Secondary | ICD-10-CM | POA: Diagnosis not present

## 2016-12-09 DIAGNOSIS — Z01419 Encounter for gynecological examination (general) (routine) without abnormal findings: Secondary | ICD-10-CM | POA: Diagnosis not present

## 2016-12-09 DIAGNOSIS — Z6841 Body Mass Index (BMI) 40.0 and over, adult: Secondary | ICD-10-CM | POA: Diagnosis not present

## 2016-12-09 DIAGNOSIS — Z1151 Encounter for screening for human papillomavirus (HPV): Secondary | ICD-10-CM | POA: Diagnosis not present

## 2016-12-09 DIAGNOSIS — Z1231 Encounter for screening mammogram for malignant neoplasm of breast: Secondary | ICD-10-CM | POA: Diagnosis not present

## 2016-12-14 DIAGNOSIS — F330 Major depressive disorder, recurrent, mild: Secondary | ICD-10-CM | POA: Diagnosis not present

## 2016-12-25 DIAGNOSIS — F330 Major depressive disorder, recurrent, mild: Secondary | ICD-10-CM | POA: Diagnosis not present

## 2017-01-02 DIAGNOSIS — F330 Major depressive disorder, recurrent, mild: Secondary | ICD-10-CM | POA: Diagnosis not present

## 2017-01-15 DIAGNOSIS — F330 Major depressive disorder, recurrent, mild: Secondary | ICD-10-CM | POA: Diagnosis not present

## 2017-01-15 DIAGNOSIS — F4311 Post-traumatic stress disorder, acute: Secondary | ICD-10-CM | POA: Diagnosis not present

## 2017-02-15 DIAGNOSIS — F330 Major depressive disorder, recurrent, mild: Secondary | ICD-10-CM | POA: Diagnosis not present

## 2017-02-22 DIAGNOSIS — F331 Major depressive disorder, recurrent, moderate: Secondary | ICD-10-CM | POA: Diagnosis not present

## 2017-02-22 DIAGNOSIS — F4311 Post-traumatic stress disorder, acute: Secondary | ICD-10-CM | POA: Diagnosis not present

## 2017-02-22 DIAGNOSIS — Z638 Other specified problems related to primary support group: Secondary | ICD-10-CM | POA: Diagnosis not present

## 2017-03-01 DIAGNOSIS — F330 Major depressive disorder, recurrent, mild: Secondary | ICD-10-CM | POA: Diagnosis not present

## 2017-03-01 DIAGNOSIS — F4310 Post-traumatic stress disorder, unspecified: Secondary | ICD-10-CM | POA: Diagnosis not present

## 2017-03-15 DIAGNOSIS — F330 Major depressive disorder, recurrent, mild: Secondary | ICD-10-CM | POA: Diagnosis not present

## 2017-03-15 DIAGNOSIS — F4310 Post-traumatic stress disorder, unspecified: Secondary | ICD-10-CM | POA: Diagnosis not present

## 2017-03-15 DIAGNOSIS — Z638 Other specified problems related to primary support group: Secondary | ICD-10-CM | POA: Diagnosis not present

## 2017-03-22 DIAGNOSIS — Z638 Other specified problems related to primary support group: Secondary | ICD-10-CM | POA: Diagnosis not present

## 2017-03-22 DIAGNOSIS — F4310 Post-traumatic stress disorder, unspecified: Secondary | ICD-10-CM | POA: Diagnosis not present

## 2017-03-22 DIAGNOSIS — F330 Major depressive disorder, recurrent, mild: Secondary | ICD-10-CM | POA: Diagnosis not present

## 2017-03-29 DIAGNOSIS — Z638 Other specified problems related to primary support group: Secondary | ICD-10-CM | POA: Diagnosis not present

## 2017-03-29 DIAGNOSIS — F330 Major depressive disorder, recurrent, mild: Secondary | ICD-10-CM | POA: Diagnosis not present

## 2017-03-29 DIAGNOSIS — F4310 Post-traumatic stress disorder, unspecified: Secondary | ICD-10-CM | POA: Diagnosis not present

## 2017-04-11 DIAGNOSIS — F330 Major depressive disorder, recurrent, mild: Secondary | ICD-10-CM | POA: Diagnosis not present

## 2017-04-11 DIAGNOSIS — F4310 Post-traumatic stress disorder, unspecified: Secondary | ICD-10-CM | POA: Diagnosis not present

## 2017-04-12 DIAGNOSIS — F330 Major depressive disorder, recurrent, mild: Secondary | ICD-10-CM | POA: Diagnosis not present

## 2017-04-26 DIAGNOSIS — Z638 Other specified problems related to primary support group: Secondary | ICD-10-CM | POA: Diagnosis not present

## 2017-04-26 DIAGNOSIS — F4310 Post-traumatic stress disorder, unspecified: Secondary | ICD-10-CM | POA: Diagnosis not present

## 2017-04-26 DIAGNOSIS — F331 Major depressive disorder, recurrent, moderate: Secondary | ICD-10-CM | POA: Diagnosis not present

## 2017-05-03 DIAGNOSIS — F330 Major depressive disorder, recurrent, mild: Secondary | ICD-10-CM | POA: Diagnosis not present

## 2017-05-17 DIAGNOSIS — F4311 Post-traumatic stress disorder, acute: Secondary | ICD-10-CM | POA: Diagnosis not present

## 2017-05-17 DIAGNOSIS — F330 Major depressive disorder, recurrent, mild: Secondary | ICD-10-CM | POA: Diagnosis not present

## 2017-05-19 DIAGNOSIS — Z118 Encounter for screening for other infectious and parasitic diseases: Secondary | ICD-10-CM | POA: Diagnosis not present

## 2017-05-19 DIAGNOSIS — Z113 Encounter for screening for infections with a predominantly sexual mode of transmission: Secondary | ICD-10-CM | POA: Diagnosis not present

## 2017-05-19 DIAGNOSIS — Z114 Encounter for screening for human immunodeficiency virus [HIV]: Secondary | ICD-10-CM | POA: Diagnosis not present

## 2017-05-19 DIAGNOSIS — Z1159 Encounter for screening for other viral diseases: Secondary | ICD-10-CM | POA: Diagnosis not present

## 2017-05-23 ENCOUNTER — Encounter (HOSPITAL_COMMUNITY): Payer: Self-pay

## 2017-05-23 ENCOUNTER — Emergency Department (HOSPITAL_COMMUNITY)
Admission: EM | Admit: 2017-05-23 | Discharge: 2017-05-23 | Disposition: A | Discharge status: Home / Self Care | Payer: BLUE CROSS/BLUE SHIELD | Attending: Emergency Medicine | Admitting: Emergency Medicine

## 2017-05-23 DIAGNOSIS — R112 Nausea with vomiting, unspecified: Secondary | ICD-10-CM

## 2017-05-23 DIAGNOSIS — K2900 Acute gastritis without bleeding: Secondary | ICD-10-CM | POA: Diagnosis not present

## 2017-05-23 DIAGNOSIS — R1013 Epigastric pain: Secondary | ICD-10-CM | POA: Diagnosis not present

## 2017-05-23 DIAGNOSIS — F17210 Nicotine dependence, cigarettes, uncomplicated: Secondary | ICD-10-CM | POA: Diagnosis not present

## 2017-05-23 DIAGNOSIS — Z79899 Other long term (current) drug therapy: Secondary | ICD-10-CM | POA: Diagnosis not present

## 2017-05-23 DIAGNOSIS — K2901 Acute gastritis with bleeding: Secondary | ICD-10-CM | POA: Diagnosis not present

## 2017-05-23 LAB — I-STAT BETA HCG BLOOD, ED (MC, WL, AP ONLY)

## 2017-05-23 LAB — COMPREHENSIVE METABOLIC PANEL
ALK PHOS: 52 U/L (ref 38–126)
ALT: 17 U/L (ref 14–54)
ANION GAP: 8 (ref 5–15)
AST: 22 U/L (ref 15–41)
Albumin: 3.4 g/dL — ABNORMAL LOW (ref 3.5–5.0)
BILIRUBIN TOTAL: 0.7 mg/dL (ref 0.3–1.2)
BUN: 10 mg/dL (ref 6–20)
CALCIUM: 9.4 mg/dL (ref 8.9–10.3)
CO2: 24 mmol/L (ref 22–32)
CREATININE: 0.8 mg/dL (ref 0.44–1.00)
Chloride: 107 mmol/L (ref 101–111)
Glucose, Bld: 98 mg/dL (ref 65–99)
Potassium: 4.5 mmol/L (ref 3.5–5.1)
SODIUM: 139 mmol/L (ref 135–145)
TOTAL PROTEIN: 7.3 g/dL (ref 6.5–8.1)

## 2017-05-23 LAB — CBC
HEMATOCRIT: 40.6 % (ref 36.0–46.0)
HEMOGLOBIN: 12.9 g/dL (ref 12.0–15.0)
MCH: 28.8 pg (ref 26.0–34.0)
MCHC: 31.8 g/dL (ref 30.0–36.0)
MCV: 90.6 fL (ref 78.0–100.0)
Platelets: 217 10*3/uL (ref 150–400)
RBC: 4.48 MIL/uL (ref 3.87–5.11)
RDW: 15 % (ref 11.5–15.5)
WBC: 6.3 10*3/uL (ref 4.0–10.5)

## 2017-05-23 LAB — LIPASE, BLOOD: LIPASE: 29 U/L (ref 11–51)

## 2017-05-23 MED ORDER — SODIUM CHLORIDE 0.9 % IV BOLUS
1000.0000 mL | Freq: Once | INTRAVENOUS | Status: AC
  Administered 2017-05-23: 1000 mL via INTRAVENOUS

## 2017-05-23 MED ORDER — ONDANSETRON 8 MG PO TBDP: 8.0000 mg | ORAL_TABLET | Freq: Three times a day (TID) | ORAL | 0 refills | Status: DC | PRN

## 2017-05-23 MED ORDER — OMEPRAZOLE 20 MG PO CPDR: 20.0000 mg | DELAYED_RELEASE_CAPSULE | Freq: Every day | ORAL | 0 refills | Status: DC

## 2017-05-23 MED ORDER — PANTOPRAZOLE SODIUM 40 MG IV SOLR
40.0000 mg | Freq: Once | INTRAVENOUS | Status: AC
  Administered 2017-05-23: 40 mg via INTRAVENOUS
  Filled 2017-05-23: qty 40

## 2017-05-23 MED ORDER — ONDANSETRON HCL 4 MG/2ML IJ SOLN
4.0000 mg | Freq: Once | INTRAMUSCULAR | Status: AC
  Administered 2017-05-23: 4 mg via INTRAVENOUS
  Filled 2017-05-23: qty 2

## 2017-05-23 NOTE — ED Triage Notes (Signed)
Pt woke up this am vomiting and states it was dark after a few times Pt felt bloated after she ate and her abdomen is distended

## 2017-05-23 NOTE — ED Provider Notes (Signed)
[facility] DEPT Provider Note   CSN: [id number] Arrival date & time: 05/23/17  0626     History   Chief Complaint Chief Complaint  Patient presents with  . Abdominal Pain    HPI Sydney Beck is a 42 y.o. female.  HPI Patient is a 42 year old female presents the emergency department with epigastric discomfort and nausea vomiting.  She states she ended up drinking more alcohol last night than she had expected to.  She reports there was a small amount of blood and one episode of her vomiting.  No history of cirrhosis.  She does not drink alcohol daily.  She denies fevers and chills.  No diarrhea.  She has a mild headache.  She feels somewhat bloated.  Symptoms are mild to moderate in severity.   Past Medical History:  Diagnosis Date  . Anxiety   . Depression     Patient Active Problem List   Diagnosis Date Noted  . Acute stress disorder 03/09/2016  . Allergic rhinitis 03/09/2016  . Moderate major depression, single episode ([place]) 03/09/2016  . Eczema of hand 01/20/2016    Past Surgical History:  Procedure Laterality Date  . CYST REMOVAL NECK    . KNEE SURGERY Right      OB History   None      Home Medications    Prior to Admission medications   Medication Sig Start Date End Date Taking? Authorizing Provider  albuterol (PROVENTIL HFA;VENTOLIN HFA) 108 (90 Base) MCG/ACT inhaler Inhale 2 puffs into the lungs every 4 (four) hours as needed for wheezing or shortness of breath (cough, shortness of breath or wheezing.). 06/20/16  Yes English, Colletta Maryland D, PA  cetirizine (ZYRTEC) 10 MG tablet Take 1 tablet (10 mg total) by mouth daily. 06/20/16  Yes English, Colletta Maryland D, PA  omeprazole (PRILOSEC) 20 MG capsule Take 1 capsule (20 mg total) by mouth daily. 05/23/17   Jola Schmidt, MD  ondansetron (ZOFRAN ODT) 8 MG disintegrating tablet Take 1 tablet (8 mg total) by mouth every 8 (eight) hours as needed for nausea or vomiting. 05/23/17   Jola Schmidt, MD    Family History Family History  Problem Relation Age of Onset  . Diabetes Father   . Depression Father   . Anxiety disorder Father   . Dementia Father   . Anxiety disorder Mother   . Depression Mother   . Schizophrenia Brother   . Alcohol abuse Brother   . Drug abuse Brother   . Depression Brother   . Anxiety disorder Brother     Social History Social History   Tobacco Use  . Smoking status: Light Tobacco Smoker    Types: Cigarettes  . Smokeless tobacco: Never Used  . Tobacco comment: socical   Substance Use Topics  . Alcohol use: Yes    Alcohol/week: 12.0 oz    Types: 15 Glasses of wine, 5 Shots of liquor per week  . Drug use: Yes    Types: Marijuana    Comment: used on a weekly basis     Allergies   Bee venom; Oxycodone hcl; and Sulfa antibiotics   Review of Systems Review of Systems  All other systems reviewed and are negative.    Physical Exam Updated Vital Signs BP (!) 137/102   Pulse 67   Temp 97.7 F (36.5 C) (Oral)   Resp 16   Ht 5\' 8"  (1.727 m)   Wt 129.3 kg (285 lb)   SpO2 100%   BMI 43.33  kg/m   Physical Exam  Constitutional: She is oriented to person, place, and time. She appears well-developed and well-nourished.  HENT:  Head: Normocephalic.  Eyes: EOM are normal.  Neck: Normal range of motion.  Pulmonary/Chest: Effort normal.  Abdominal: She exhibits no distension.  Musculoskeletal: Normal range of motion.  Neurological: She is alert and oriented to person, place, and time.  Psychiatric: She has a normal mood and affect.  Nursing note and vitals reviewed.    ED Treatments / Results  Labs (all labs ordered are listed, but only abnormal results are displayed) Labs Reviewed  COMPREHENSIVE METABOLIC PANEL - Abnormal; Notable for the following components:      Result Value   Albumin 3.4 (*)    All other components within normal limits  CBC  LIPASE, BLOOD  I-STAT BETA HCG BLOOD, ED ([facility], [facility], [facility] ONLY)     EKG None  Radiology No results found.  Procedures Procedures (including critical care time)  Medications Ordered in ED Medications  ondansetron (ZOFRAN) injection 4 mg (4 mg Intravenous Given 05/23/17 0858)  pantoprazole (PROTONIX) injection 40 mg (40 mg Intravenous Given 05/23/17 0858)  sodium chloride 0.9 % bolus 1,000 mL (0 mLs Intravenous Stopped 05/23/17 1000)     Initial Impression / Assessment and Plan / ED Course  I have reviewed the triage vital signs and the nursing notes.  Pertinent labs & imaging results that were available during my care of the patient were reviewed by me and considered in my medical decision making (see chart for details).     Patient is overall well-appearing.  She feels much better at this time.  Likely alcohol induced acute gastritis as the cause of her nausea vomiting and epigastric pain.  She may have a small Mallory-Weiss tear which could have explained the small amount of blood.  She has had no frank hematemesis.  Patient understands return to the ER for new or worsening symptoms  Final Clinical Impressions(s) / ED Diagnoses   Final diagnoses:  Nausea and vomiting, intractability of vomiting not specified, unspecified vomiting type  Acute gastritis, presence of bleeding unspecified, unspecified gastritis type    ED Discharge Orders        Ordered    ondansetron (ZOFRAN ODT) 8 MG disintegrating tablet  Every 8 hours PRN     05/23/17 1017    omeprazole (PRILOSEC) 20 MG capsule  Daily     05/23/17 1017       Jola Schmidt, MD 05/23/17 1020

## 2017-05-24 ENCOUNTER — Encounter: Payer: Self-pay | Admitting: Physician Assistant

## 2017-05-24 DIAGNOSIS — F330 Major depressive disorder, recurrent, mild: Secondary | ICD-10-CM | POA: Diagnosis not present

## 2017-05-31 DIAGNOSIS — F330 Major depressive disorder, recurrent, mild: Secondary | ICD-10-CM | POA: Diagnosis not present

## 2017-06-12 ENCOUNTER — Encounter: Payer: Self-pay | Admitting: Family Medicine

## 2017-06-12 ENCOUNTER — Other Ambulatory Visit: Payer: Self-pay

## 2017-06-12 ENCOUNTER — Ambulatory Visit: Payer: BLUE CROSS/BLUE SHIELD | Admitting: Family Medicine

## 2017-06-12 VITALS — BP 125/87 | HR 100 | Temp 99.1°F | Resp 17 | Ht 68.0 in | Wt 281.8 lb

## 2017-06-12 DIAGNOSIS — R070 Pain in throat: Secondary | ICD-10-CM

## 2017-06-12 DIAGNOSIS — Z309 Encounter for contraceptive management, unspecified: Secondary | ICD-10-CM | POA: Diagnosis not present

## 2017-06-12 DIAGNOSIS — A6004 Herpesviral vulvovaginitis: Secondary | ICD-10-CM | POA: Diagnosis not present

## 2017-06-12 DIAGNOSIS — B9689 Other specified bacterial agents as the cause of diseases classified elsewhere: Secondary | ICD-10-CM | POA: Diagnosis not present

## 2017-06-12 DIAGNOSIS — Z113 Encounter for screening for infections with a predominantly sexual mode of transmission: Secondary | ICD-10-CM | POA: Diagnosis not present

## 2017-06-12 DIAGNOSIS — N760 Acute vaginitis: Secondary | ICD-10-CM

## 2017-06-12 DIAGNOSIS — N949 Unspecified condition associated with female genital organs and menstrual cycle: Secondary | ICD-10-CM

## 2017-06-12 LAB — POCT WET + KOH PREP
Trich by wet prep: ABSENT
Yeast by KOH: ABSENT
Yeast by wet prep: ABSENT

## 2017-06-12 LAB — POCT URINE PREGNANCY: Preg Test, Ur: NEGATIVE

## 2017-06-12 MED ORDER — VALACYCLOVIR HCL 1 G PO TABS: 1000.0000 mg | ORAL_TABLET | Freq: Two times a day (BID) | ORAL | 0 refills | Status: DC

## 2017-06-12 MED ORDER — METRONIDAZOLE 500 MG PO TABS: 500.0000 mg | ORAL_TABLET | Freq: Two times a day (BID) | ORAL | 0 refills | Status: DC

## 2017-06-12 NOTE — Patient Instructions (Addendum)
Start taking valacyclovir 1000 mg one tablet twice a day for 10 days      IF you received an x-ray today, you will receive an invoice from [facility] Radiology. Please contact [facility]iology at [phone] with questions or concerns regarding your invoice.   IF you received labwork today, you will receive an invoice from [place]. Please contact [facility] at [phone] with questions or concerns regarding your invoice.   Our billing staff will not be able to assist you with questions regarding bills from these companies.  You will be contacted with the lab results as soon as they are available. The fastest way to get your results is to activate your My Chart account. Instructions are located on the last page of this paperwork. If you have not heard from [place] regarding the results in 2 weeks, please contact this office.     Bacterial Vaginosis Bacterial vaginosis is a vaginal infection that occurs when the normal balance of bacteria in the vagina is disrupted. It results from an overgrowth of certain bacteria. This is the most common vaginal infection among women ages 98-44. Because bacterial vaginosis increases your risk for STIs (sexually transmitted infections), getting treated can help reduce your risk for chlamydia, gonorrhea, herpes, and HIV (human immunodeficiency virus). Treatment is also important for preventing complications in pregnant women, because this condition can cause an early (premature) delivery. What are the causes? This condition is caused by an increase in harmful bacteria that are normally present in small amounts in the vagina. However, the reason that the condition develops is not fully understood. What increases the risk? The following factors may make you more likely to develop this condition:  Having a new sexual partner or multiple sexual partners.  Having unprotected sex.  Douching.  Having an intrauterine device (IUD).  Smoking.  Drug and alcohol  abuse.  Taking certain antibiotic medicines.  Being pregnant.  You cannot get bacterial vaginosis from toilet seats, bedding, swimming pools, or contact with objects around you. What are the signs or symptoms? Symptoms of this condition include:  Grey or white vaginal discharge. The discharge can also be watery or foamy.  A fish-like odor with discharge, especially after sexual intercourse or during menstruation.  Itching in and around the vagina.  Burning or pain with urination.  Some women with bacterial vaginosis have no signs or symptoms. How is this diagnosed? This condition is diagnosed based on:  Your medical history.  A physical exam of the vagina.  Testing a sample of vaginal fluid under a microscope to look for a large amount of bad bacteria or abnormal cells. Your health care provider may use a cotton swab or a small wooden spatula to collect the sample.  How is this treated? This condition is treated with antibiotics. These may be given as a pill, a vaginal cream, or a medicine that is put into the vagina (suppository). If the condition comes back after treatment, a second round of antibiotics may be needed. Follow these instructions at home: Medicines  Take over-the-counter and prescription medicines only as told by your health care provider.  Take or use your antibiotic as told by your health care provider. Do not stop taking or using the antibiotic even if you start to feel better. General instructions  If you have a female sexual partner, tell her that you have a vaginal infection. She should see her health care provider and be treated if she has symptoms. If you have a female sexual partner, he  does not need treatment.  During treatment: ? Avoid sexual activity until you finish treatment. ? Do not douche. ? Avoid alcohol as directed by your health care provider. ? Avoid breastfeeding as directed by your health care provider.  Drink enough water and fluids  to keep your urine clear or pale yellow.  Keep the area around your vagina and rectum clean. ? Wash the area daily with warm water. ? Wipe yourself from front to back after using the toilet.  Keep all follow-up visits as told by your health care provider. This is important. How is this prevented?  Do not douche.  Wash the outside of your vagina with warm water only.  Use protection when having sex. This includes latex condoms and dental dams.  Limit how many sexual partners you have. To help prevent bacterial vaginosis, it is best to have sex with just one partner (monogamous).  Make sure you and your sexual partner are tested for STIs.  Wear cotton or cotton-lined underwear.  Avoid wearing tight pants and pantyhose, especially during summer.  Limit the amount of alcohol that you drink.  Do not use any products that contain nicotine or tobacco, such as cigarettes and e-cigarettes. If you need help quitting, ask your health care provider.  Do not use illegal drugs. Where to find more information:  Centers for Disease Control and Prevention: AppraiserFraud.fi  American Sexual Health Association (ASHA): www.ashastd.org  U.S. Department of Health and Financial controller, Office on Women's Health: DustingSprays.pl or SecuritiesCard.it Contact a health care provider if:  Your symptoms do not improve, even after treatment.  You have more discharge or pain when urinating.  You have a fever.  You have pain in your abdomen.  You have pain during sex.  You have vaginal bleeding between periods. Summary  Bacterial vaginosis is a vaginal infection that occurs when the normal balance of bacteria in the vagina is disrupted.  Because bacterial vaginosis increases your risk for STIs (sexually transmitted infections), getting treated can help reduce your risk for chlamydia, gonorrhea, herpes, and HIV (human immunodeficiency virus). Treatment  is also important for preventing complications in pregnant women, because the condition can cause an early (premature) delivery.  This condition is treated with antibiotic medicines. These may be given as a pill, a vaginal cream, or a medicine that is put into the vagina (suppository). This information is not intended to replace advice given to you by your health care provider. Make sure you discuss any questions you have with your health care provider. Document Released: 01/31/2005 Document Revised: 06/06/2016 Document Reviewed: 10/17/2015 Elsevier Interactive Patient Education  [employer].

## 2017-06-12 NOTE — Progress Notes (Signed)
Chief Complaint  Patient presents with  . Sore Throat    itchy in private area, sexual intercourse and condom broke  . right ear pain    HPI   Screening for stds Genital lesions  Patient reports that she had a condom break She states that she also has itching in the genital area She performed a self-collect wet prep She states that she had the condom break 06/02/17 She states that her period came 06/07/2017 She states that she would like  She reports that she shaved on the 06/03/17 No pelvic pain No abdominal pain  Sore throat and ear pain She took doxycycline 100mg  for Saturday, Sunday and one dose today, Monday. She reports that she had sore throat She states that she was getting pain in the right ear No fevers or chills No dizziness No nausea or vomiting   Past Medical History:  Diagnosis Date  . Anxiety   . Depression     Current Outpatient Medications  Medication Sig Dispense Refill  . albuterol (PROVENTIL HFA;VENTOLIN HFA) 108 (90 Base) MCG/ACT inhaler Inhale 2 puffs into the lungs every 4 (four) hours as needed for wheezing or shortness of breath (cough, shortness of breath or wheezing.). 1 Inhaler 1  . cetirizine (ZYRTEC) 10 MG tablet Take 1 tablet (10 mg total) by mouth daily. 30 tablet 11  . metroNIDAZOLE (FLAGYL) 500 MG tablet Take 1 tablet (500 mg total) by mouth 2 (two) times daily. 14 tablet 0  . omeprazole (PRILOSEC) 20 MG capsule Take 1 capsule (20 mg total) by mouth daily. (Patient not taking: Reported on 06/12/2017) 30 capsule 0  . ondansetron (ZOFRAN ODT) 8 MG disintegrating tablet Take 1 tablet (8 mg total) by mouth every 8 (eight) hours as needed for nausea or vomiting. (Patient not taking: Reported on 06/12/2017) 10 tablet 0  . valACYclovir (VALTREX) 1000 MG tablet Take 1 tablet (1,000 mg total) by mouth 2 (two) times daily. 20 tablet 0   No current facility-administered medications for this visit.     Allergies:  Allergies  Allergen Reactions  .  Bee Venom Swelling    Other reaction(s): itching /swelling  . Oxycodone Hcl Itching    Other reaction(s): itching  . Sulfa Antibiotics     Other reaction(s): itching Itching, hives    Past Surgical History:  Procedure Laterality Date  . CYST REMOVAL NECK    . KNEE SURGERY Right     Social History   Socioeconomic History  . Marital status: Single    Spouse name: Not on file  . Number of children: Not on file  . Years of education: Not on file  . Highest education level: Not on file  Occupational History  . Not on file  Social Needs  . Financial resource strain: Not on file  . Food insecurity:    Worry: Not on file    Inability: Not on file  . Transportation needs:    Medical: Not on file    Non-medical: Not on file  Tobacco Use  . Smoking status: Light Tobacco Smoker    Types: Cigarettes  . Smokeless tobacco: Never Used  . Tobacco comment: socical   Substance and Sexual Activity  . Alcohol use: Yes    Alcohol/week: 12.0 oz    Types: 15 Glasses of wine, 5 Shots of liquor per week  . Drug use: Yes    Types: Marijuana    Comment: used on a weekly basis  . Sexual activity: Yes  Lifestyle  .  Physical activity:    Days per week: Not on file    Minutes per session: Not on file  . Stress: Not on file  Relationships  . Social connections:    Talks on phone: Not on file    Gets together: Not on file    Attends religious service: Not on file    Active member of club or organization: Not on file    Attends meetings of clubs or organizations: Not on file    Relationship status: Not on file  Other Topics Concern  . Not on file  Social History Narrative  . Not on file    Family History  Problem Relation Age of Onset  . Diabetes Father   . Depression Father   . Anxiety disorder Father   . Dementia Father   . Anxiety disorder Mother   . Depression Mother   . Schizophrenia Brother   . Alcohol abuse Brother   . Drug abuse Brother   . Depression Brother   .  Anxiety disorder Brother      ROS Review of Systems See HPI Constitution: No fevers or chills No malaise No diaphoresis Skin: No rash or itching Eyes: no blurry vision, no double vision GU: no dysuria or hematuria Neuro: no dizziness or headaches * all others reviewed and negative   Objective: Vitals:   06/12/17 1723  BP: 125/87  Pulse: 100  Resp: 17  Temp: 99.1 F (37.3 C)  TempSrc: Oral  SpO2: 100%  Weight: 281 lb 12.8 oz (127.8 kg)  Height: 5\' 8"  (1.727 m)    Physical Exam  General: alert, oriented, in NAD Head: normocephalic, atraumatic, no sinus tenderness Eyes: EOM intact, no scleral icterus or conjunctival injection Ears: TM clear bilaterally Nose: mucosa nonerythematous, nonedematous Throat: no pharyngeal exudate or erythema Lymph: no posterior auricular, submental or cervical lymph adenopathy Heart: normal rate, normal sinus rhythm, no murmurs Lungs: clear to auscultation bilaterally, no wheezing   Vaginal exam- Chaperone Present Labia normal anatomy Skin of the labia with vesicular appearance Culture collected to check for HSV Urethral meatus normal appearing without erythema Vagina with watery discharge No CMT, ovaries small and not palpable Uterus midline, nontender  Assessment and Plan Sydney Beck was seen today for sore throat and right ear pain.  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     [place]

## 2017-06-13 LAB — HIV ANTIBODY (ROUTINE TESTING W REFLEX): HIV Screen 4th Generation wRfx: NONREACTIVE

## 2017-06-13 LAB — HEPATITIS B SURFACE ANTIGEN: Hepatitis B Surface Ag: NEGATIVE

## 2017-06-13 LAB — HSV(HERPES SIMPLEX VRS) I + II AB-IGG

## 2017-06-13 LAB — RPR: RPR: NONREACTIVE

## 2017-06-14 ENCOUNTER — Telehealth: Payer: Self-pay

## 2017-06-14 DIAGNOSIS — F330 Major depressive disorder, recurrent, mild: Secondary | ICD-10-CM | POA: Diagnosis not present

## 2017-06-14 LAB — GC/CHLAMYDIA PROBE AMP
Chlamydia trachomatis, NAA: NEGATIVE
Neisseria gonorrhoeae by PCR: NEGATIVE

## 2017-06-14 NOTE — Telephone Encounter (Signed)
Pt advised that it can take up to 2 weeks to hear back on labs.  Copied from [place] [phone]. Topic: General - Other >> Jun 14, 2017  8:54 AM Conception Chancy, NT wrote: Reason for CRM: patient is calling and would like to get results from 06/12/17. Please call back.

## 2017-06-16 LAB — HERPES SIMPLEX VIRUS CULTURE

## 2017-06-19 ENCOUNTER — Telehealth: Payer: Self-pay | Admitting: Family Medicine

## 2017-06-19 ENCOUNTER — Telehealth: Payer: Self-pay | Admitting: *Deleted

## 2017-06-19 ENCOUNTER — Encounter: Payer: Self-pay | Admitting: Family Medicine

## 2017-06-19 DIAGNOSIS — A6004 Herpesviral vulvovaginitis: Secondary | ICD-10-CM | POA: Insufficient documentation

## 2017-06-19 NOTE — Telephone Encounter (Signed)
Copied from [place] [phone]. Topic: Quick Communication - See Telephone Encounter >> Jun 19, 2017 10:53 AM Bea Graff, NT wrote: CRM for notification. See Telephone encounter for: 06/19/17. Pt calling back to get her lab results.

## 2017-06-19 NOTE — Telephone Encounter (Signed)
Patient was advised and voiced understanding 

## 2017-06-19 NOTE — Telephone Encounter (Signed)
Pt already advised.

## 2017-06-19 NOTE — Telephone Encounter (Signed)
Left message to check her mychart and she should be getting a call from our office results pool

## 2017-07-17 DIAGNOSIS — N644 Mastodynia: Secondary | ICD-10-CM | POA: Diagnosis not present

## 2017-07-17 DIAGNOSIS — R102 Pelvic and perineal pain: Secondary | ICD-10-CM | POA: Diagnosis not present

## 2017-08-03 ENCOUNTER — Other Ambulatory Visit: Payer: Self-pay | Admitting: Family Medicine

## 2017-08-03 DIAGNOSIS — N949 Unspecified condition associated with female genital organs and menstrual cycle: Secondary | ICD-10-CM

## 2017-08-03 MED ORDER — VALACYCLOVIR HCL 1 G PO TABS: 1000.0000 mg | ORAL_TABLET | Freq: Two times a day (BID) | ORAL | 0 refills | Status: DC

## 2017-08-25 ENCOUNTER — Encounter: Payer: Self-pay | Admitting: Physician Assistant

## 2017-08-25 ENCOUNTER — Ambulatory Visit: Payer: BLUE CROSS/BLUE SHIELD | Admitting: Physician Assistant

## 2017-08-25 ENCOUNTER — Other Ambulatory Visit: Payer: Self-pay

## 2017-08-25 VITALS — BP 128/86 | HR 97 | Temp 99.2°F | Resp 20 | Ht 68.31 in | Wt 275.8 lb

## 2017-08-25 DIAGNOSIS — J45909 Unspecified asthma, uncomplicated: Secondary | ICD-10-CM

## 2017-08-25 DIAGNOSIS — L988 Other specified disorders of the skin and subcutaneous tissue: Secondary | ICD-10-CM | POA: Diagnosis not present

## 2017-08-25 DIAGNOSIS — A6004 Herpesviral vulvovaginitis: Secondary | ICD-10-CM | POA: Diagnosis not present

## 2017-08-25 DIAGNOSIS — L089 Local infection of the skin and subcutaneous tissue, unspecified: Secondary | ICD-10-CM

## 2017-08-25 MED ORDER — CETIRIZINE HCL 10 MG PO TABS: 10.0000 mg | ORAL_TABLET | Freq: Every day | ORAL | 3 refills | Status: DC

## 2017-08-25 MED ORDER — DOXYCYCLINE HYCLATE 100 MG PO CAPS: 100.0000 mg | ORAL_CAPSULE | Freq: Two times a day (BID) | ORAL | 0 refills | Status: DC

## 2017-08-25 MED ORDER — NYSTATIN 100000 UNIT/GM EX POWD: Freq: Four times a day (QID) | CUTANEOUS | 0 refills | Status: DC

## 2017-08-25 MED ORDER — ALBUTEROL SULFATE HFA 108 (90 BASE) MCG/ACT IN AERS: 2.0000 | INHALATION_SPRAY | RESPIRATORY_TRACT | 1 refills | Status: DC | PRN

## 2017-08-25 MED ORDER — FLUCONAZOLE 150 MG PO TABS: 150.0000 mg | ORAL_TABLET | Freq: Once | ORAL | 0 refills | Status: AC

## 2017-08-25 MED ORDER — VALACYCLOVIR HCL 1 G PO TABS: 1000.0000 mg | ORAL_TABLET | Freq: Two times a day (BID) | ORAL | 2 refills | Status: DC

## 2017-08-25 NOTE — Patient Instructions (Addendum)
For boil, take doxy for the next 10 days. Apply warm compresses to affected area 4-5 x a day for 20 minutes at a time. Try to limit smoking.   While taking Doxycycline:  -Do not drink milk or take iron supplements, multivitamins, calcium supplements, antacids, laxatives within 2 hours before or after taking doxycycline. -Avoid direct exposure to sunlight or tanning beds. Doxycycline can make you sunburn more easily. Wear protective clothing and use sunscreen (SPF 30 or higher) when you are outdoors. -Antibiotic medicines can cause diarrhea, which may be a sign of a new infection. If you have diarrhea that is watery or bloody, stop taking this medicine and seek medical care.    For itching area above the boil: apply topical nystatin powder 3-4 times per day. Use diflucan now and then again after you finish antibiotics. Avoid applying moist creams and try to keep area as dry as possible. Sleep without underwear.    For genital HSV: take 0.5 tablet every 12 hours x 3 days. Start ASAP w/in 24 hours of symptom onset.   For oral HSV: Take 2 tablets every 12 hours x 1 day.  Start ASAP w/in 24 hours of symptom onset.   Follow up as needed. Thank you for letting me participate in your health and well being.  Hidradenitis Suppurativa Hidradenitis suppurativa is a long-term (chronic) skin disease that starts with blocked sweat glands or hair follicles. Bacteria may grow in these blocked openings of your skin. Hidradenitis suppurativa is like a severe form of acne that develops in areas of your body where acne would be unusual. It is most likely to affect the areas of your body where skin rubs against skin and becomes moist. This includes your:  Underarms.  Groin.  Genital areas.  Buttocks.  Upper thighs.  Breasts.  Hidradenitis suppurativa may start out with small pimples. The pimples can develop into deep sores that break open (rupture) and drain pus. Over time your skin may thicken and become  scarred. Hidradenitis suppurativa cannot be passed from person to person. What are the causes? The exact cause of hidradenitis suppurativa is not known. This condition may be due to:  Female and female hormones. The condition is rare before and after puberty.  An overactive body defense system (immune system). Your immune system may overreact to the blocked hair follicles or sweat glands and cause swelling and pus-filled sores.  What increases the risk? You may have a higher risk of hidradenitis suppurativa if you:  Are a woman.  Are between ages 36 and 22.  Have a family history of hidradenitis suppurativa.  Have a personal history of acne.  Are overweight.  Smoke.  Take the drug lithium.  What are the signs or symptoms? The first signs of an outbreak are usually painful skin bumps that look like pimples. As the condition progresses:  Skin bumps may get bigger and grow deeper into the skin.  Bumps under the skin may rupture and drain smelly pus.  Skin may become itchy and infected.  Skin may thicken and scar.  Drainage may continue through tunnels under the skin (fistulas).  Walking and moving your arms can become painful.  How is this diagnosed? Your health care provider may diagnose hidradenitis suppurativa based on your medical history and your signs and symptoms. A physical exam will also be done. You may need to see a health care provider who specializes in skin diseases (dermatologist). You may also have tests done to confirm the diagnosis. These  can include:  Swabbing a sample of pus or drainage from your skin so it can be sent to the lab and tested for infection.  Blood tests to check for infection.  How is this treated? The same treatment will not work for everybody with hidradenitis suppurativa. Your treatment will depend on how severe your symptoms are. You may need to try several treatments to find what works best for you. Part of your treatment may include  cleaning and bandaging (dressing) your wounds. You may also have to take medicines, such as the following:  Antibiotics.  Acne medicines.  Medicines to block or suppress the immune system.  A diabetes medicine (metformin) is sometimes used to treat this condition.  For women, birth control pills can sometimes help relieve symptoms.  You may need surgery if you have a severe case of hidradenitis suppurativa that does not respond to medicine. Surgery may involve:  Using a laser to clear the skin and remove hair follicles.  Opening and draining deep sores.  Removing the areas of skin that are diseased and scarred.  Follow these instructions at home:  Learn as much as you can about your disease, and work closely with your health care providers.  Take medicines only as directed by your health care provider.  If you were prescribed an antibiotic medicine, finish it all even if you start to feel better.  If you are overweight, losing weight may be very helpful. Try to reach and maintain a healthy weight.  Do not use any tobacco products, including cigarettes, chewing tobacco, or electronic cigarettes. If you need help quitting, ask your health care provider.  Do not shave the areas where you get hidradenitis suppurativa.  Do not wear deodorant.  Wear loose-fitting clothes.  Try not to overheat and get sweaty.  Take a daily bleach bath as directed by your health care provider. ? Fill your bathtub halfway with water. ? Pour in  cup of unscented household bleach. ? Soak for 5-10 minutes.  Cover sore areas with a warm, clean washcloth (compress) for 5-10 minutes. Contact a health care provider if:  You have a flare-up of hidradenitis suppurativa.  You have chills or a fever.  You are having trouble controlling your symptoms at home. This information is not intended to replace advice given to you by your health care provider. Make sure you discuss any questions you have with  your health care provider. Document Released: 09/15/2003 Document Revised: 07/09/2015 Document Reviewed: 05/03/2013 Elsevier Interactive Patient Education  2018 [employer].   IF you received an x-ray today, you will receive an invoice from [facility]. Please contact [facility] at [phone] with questions or concerns regarding your invoice.   IF you received labwork today, you will receive an invoice from [place]. Please contact [facility] at [phone] with questions or concerns regarding your invoice.   Our billing staff will not be able to assist you with questions regarding bills from these companies.  You will be contacted with the lab results as soon as they are available. The fastest way to get your results is to activate your My Chart account. Instructions are located on the last page of this paperwork. If you have not heard from [place] regarding the results in 2 weeks, please contact this office.

## 2017-08-25 NOTE — Progress Notes (Signed)
Sydney Beck  MRN: [id number] DOB: September 01, 1975  Subjective:  Sydney Beck is a 42 y.o. female seen in office today for a chief complaint of multiple complaints.  1) buttocks boil x1 week: Has had issues with this in the past.  Diagnosis of hidradenitis suppurativa.  Has had them drained in the past.  For this particular instance, has noticed swollen, painful, red, itchy boil on the right buttocks for the past week.  Did notice purulent drainage for the past few days.  Has had mild fever.  Has tried sitz bath and approximately 4 doses of doxycycline, which was left over from last incident.  2) itchy spot just superior to boil for the past few days.  Noticed has tried applyingit after keeping a bandage on the boil for the past week.  Antibiotic ointment with no full relief.  3) needs refill for Valtrex.  Just received diagnosis of herpes in April 2019 after having painful genital lesions.  She also experiences oral lesions.  The culture was positive for HSV 1 and not determine if it was HSV 1 or 2.  She is not currently having an outbreak but does feel some tingling in the labia majora.  4) needs refill for albuterol inhaler and Zyrtec.  Has diagnosis of allergy induced asthma.  She feels well controlled at this time.  Only uses albuterol inhaler about 3 times per year.  Takes Zyrtec daily.  Denies wheezing, shortness of breath, chest pain, fever, chills, nausea, vomiting.  No other questions or concerns Review of Systems Per HPI Patient Active Problem List   Diagnosis Date Noted  . Herpes simplex vulvovaginitis 06/19/2017  . Acute stress disorder 03/09/2016  . Allergic rhinitis 03/09/2016  . Moderate major depression, single episode ([place]) 03/09/2016  . Eczema of hand 01/20/2016    Current Outpatient Medications on File Prior to Visit  Medication Sig Dispense Refill  . albuterol (PROVENTIL HFA;VENTOLIN HFA) 108 (90 Base) MCG/ACT inhaler Inhale 2 puffs into the lungs every 4 (four)  hours as needed for wheezing or shortness of breath (cough, shortness of breath or wheezing.). 1 Inhaler 1  . cetirizine (ZYRTEC) 10 MG tablet Take 1 tablet (10 mg total) by mouth daily. 30 tablet 11  . ondansetron (ZOFRAN ODT) 8 MG disintegrating tablet Take 1 tablet (8 mg total) by mouth every 8 (eight) hours as needed for nausea or vomiting. 10 tablet 0  . valACYclovir (VALTREX) 1000 MG tablet Take 1 tablet (1,000 mg total) by mouth 2 (two) times daily. 20 tablet 0  . metroNIDAZOLE (FLAGYL) 500 MG tablet Take 1 tablet (500 mg total) by mouth 2 (two) times daily. (Patient not taking: Reported on 08/25/2017) 14 tablet 0  . omeprazole (PRILOSEC) 20 MG capsule Take 1 capsule (20 mg total) by mouth daily. (Patient not taking: Reported on 06/12/2017) 30 capsule 0   No current facility-administered medications on file prior to visit.     Allergies  Allergen Reactions  . Bee Venom Swelling    Other reaction(s): itching /swelling  . Oxycodone Hcl Itching    Other reaction(s): itching  . Sulfa Antibiotics     Other reaction(s): itching Itching, hives     Objective:  BP 128/86 (BP Location: Left Arm, Patient Position: Sitting, Cuff Size: Large)   Pulse 97   Temp 99.2 F (37.3 C) (Oral)   Resp 20   Ht 5' 8.31" (1.735 m)   Wt 275 lb 12.8 oz (125.1 kg)   LMP 08/12/2017 (Approximate)   SpO2  97%   BMI 41.56 kg/m   Physical Exam  Constitutional: She is oriented to person, place, and time. She appears well-developed and well-nourished. No distress.  HENT:  Head: Normocephalic and atraumatic.  Eyes: Conjunctivae are normal.  Neck: Normal range of motion.  Cardiovascular: Normal rate, regular rhythm and normal heart sounds.  Pulmonary/Chest: Effort normal. She has no decreased breath sounds. She has no wheezes. She has no rhonchi. She has no rales.  Neurological: She is alert and oriented to person, place, and time.  Skin: Skin is warm and dry.     Psychiatric: She has a normal mood and  affect.  Vitals reviewed.   Assessment and Plan :  1. Skin infection No fluctuance noted.  I&D not warranted.  Will treat with antibiotics and recommend warm compresses 4-5 times a day for 20 minutes at a time.  Advised to return to clinic if symptoms worsen, do not improve, or as needed.  - doxycycline (VIBRAMYCIN) 100 MG capsule; Take 1 capsule (100 mg total) by mouth 2 (two) times daily.  Dispense: 20 capsule; Refill: 0  2. Skin maceration Patient has large body habitus.  This is likely due to use of bandages and already warm moist area.  No signs for overlying bacterial infection.  With overlying itching, concern for underlying yeast infection.  Recommend topical nystatin powder.  Considering we are treating her skin infection with antibiotic, will give oral Diflucan as well.  Recommended to keep the area as dry as possible.  Discontinue topical antibiotic ointment. - nystatin (MYCOSTATIN/NYSTOP) powder; Apply topically 4 (four) times daily.  Dispense: 30 g; Refill: 0 - fluconazole (DIFLUCAN) 150 MG tablet; Take 1 tablet (150 mg total) by mouth once for 1 dose. Repeat if needed  Dispense: 2 tablet; Refill: 0  3. Asthma due to seasonal allergies - cetirizine (ZYRTEC) 10 MG tablet; Take 1 tablet (10 mg total) by mouth daily.  Dispense: 90 tablet; Refill: 3 - albuterol (PROVENTIL HFA;VENTOLIN HFA) 108 (90 Base) MCG/ACT inhaler; Inhale 2 puffs into the lungs every 4 (four) hours as needed for wheezing or shortness of breath (cough, shortness of breath or wheezing.).  Dispense: 1 Inhaler; Refill: 1  4. Herpes simplex vulvovaginitis - valACYclovir (VALTREX) 1000 MG tablet; Take 1 tablet (1,000 mg total) by mouth 2 (two) times daily.  Dispense: 20 tablet; Refill: 2  Side effects, risks, benefits, and alternatives of the medications and treatment plan prescribed today were discussed, and patient expressed understanding of the instructions given. No barriers to understanding were identified. Red  flags discussed in detail. Pt expressed understanding regarding what to do in case of emergency/urgent symptoms.   [name] PA-C  [facility] 08/25/2017 10:01 AM

## 2017-08-30 ENCOUNTER — Ambulatory Visit: Payer: BLUE CROSS/BLUE SHIELD | Admitting: Family Medicine

## 2017-08-30 ENCOUNTER — Encounter: Payer: Self-pay | Admitting: Family Medicine

## 2017-08-30 ENCOUNTER — Ambulatory Visit: Payer: Self-pay

## 2017-08-30 ENCOUNTER — Other Ambulatory Visit: Payer: Self-pay

## 2017-08-30 VITALS — BP 112/84 | HR 95 | Temp 99.8°F | Resp 17 | Ht 68.31 in | Wt 275.4 lb

## 2017-08-30 DIAGNOSIS — F17210 Nicotine dependence, cigarettes, uncomplicated: Secondary | ICD-10-CM

## 2017-08-30 DIAGNOSIS — R9431 Abnormal electrocardiogram [ECG] [EKG]: Secondary | ICD-10-CM | POA: Diagnosis not present

## 2017-08-30 DIAGNOSIS — R0789 Other chest pain: Secondary | ICD-10-CM

## 2017-08-30 DIAGNOSIS — F4322 Adjustment disorder with anxiety: Secondary | ICD-10-CM

## 2017-08-30 DIAGNOSIS — K219 Gastro-esophageal reflux disease without esophagitis: Secondary | ICD-10-CM

## 2017-08-30 MED ORDER — OMEPRAZOLE 20 MG PO CPDR: 20.0000 mg | DELAYED_RELEASE_CAPSULE | Freq: Every day | ORAL | 0 refills | Status: DC

## 2017-08-30 NOTE — Patient Instructions (Addendum)
   IF you received an x-ray today, you will receive an invoice from [facility]. Please contact [facility] at [phone] with questions or concerns regarding your invoice.   IF you received labwork today, you will receive an invoice from [facility]. Please contact [facility] at [phone] with questions or concerns regarding your invoice.   Our billing staff will not be able to assist you with questions regarding bills from these companies.  You will be contacted with the lab results as soon as they are available. The fastest way to get your results is to activate your My Chart account. Instructions are located on the last page of this paperwork. If you have not heard from us regarding the results in 2 weeks, please contact this office.     Nonspecific Chest Pain Chest pain can be caused by many different conditions. There is always a chance that your pain could be related to something serious, such as a heart attack or a blood clot in your lungs. Chest pain can also be caused by conditions that are not life-threatening. If you have chest pain, it is very important to follow up with your health care provider. What are the causes? Causes of this condition include:  Heartburn.  Pneumonia or bronchitis.  Anxiety or stress.  Inflammation around your heart (pericarditis) or lung (pleuritis or pleurisy).  A blood clot in your lung.  A collapsed lung (pneumothorax). This can develop suddenly on its own (spontaneous pneumothorax) or from trauma to the chest.  Shingles infection (varicella-zoster virus).  Heart attack.  Damage to the bones, muscles, and cartilage that make up your chest wall. This can include: ? Bruised bones due to injury. ? Strained muscles or cartilage due to frequent or repeated coughing or overwork. ? Fracture to one or more ribs. ? Sore cartilage due to inflammation (costochondritis).  What increases the risk? Risk factors for this condition  may include:  Activities that increase your risk for trauma or injury to your chest.  Respiratory infections or conditions that cause frequent coughing.  Medical conditions or overeating that can cause heartburn.  Heart disease or family history of heart disease.  Conditions or health behaviors that increase your risk of developing a blood clot.  Having had chicken pox (varicella zoster).  What are the signs or symptoms? Chest pain can feel like:  Burning or tingling on the surface of your chest or deep in your chest.  Crushing, pressure, aching, or squeezing pain.  Dull or sharp pain that is worse when you move, cough, or take a deep breath.  Pain that is also felt in your back, neck, shoulder, or arm, or pain that spreads to any of these areas.  Your chest pain may come and go, or it may stay constant. How is this diagnosed? Lab tests or other studies may be needed to find the cause of your pain. Your health care provider may have you take a test called an ECG (electrocardiogram). An ECG records your heartbeat patterns at the time the test is performed. You may also have other tests, such as:  Transthoracic echocardiogram (TTE). In this test, sound waves are used to create a picture of the heart structures and to look at how blood flows through your heart.  Transesophageal echocardiogram (TEE).This is a more advanced imaging test that takes images from inside your body. It allows your health care provider to see your heart in finer detail.  Cardiac monitoring. This allows your health care provider to   monitor your heart rate and rhythm in real time.  Holter monitor. This is a portable device that records your heartbeat and can help to diagnose abnormal heartbeats. It allows your health care provider to track your heart activity for several days, if needed.  Stress tests. These can be done through exercise or by taking medicine that makes your heart beat more quickly.  Blood  tests.  Other imaging tests.  How is this treated? Treatment depends on what is causing your chest pain. Treatment may include:  Medicines. These may include: ? Acid blockers for heartburn. ? Anti-inflammatory medicine. ? Pain medicine for inflammatory conditions. ? Antibiotic medicine, if an infection is present. ? Medicines to dissolve blood clots. ? Medicines to treat coronary artery disease (CAD).  Supportive care for conditions that do not require medicines. This may include: ? Resting. ? Applying heat or cold packs to injured areas. ? Limiting activities until pain decreases.  Follow these instructions at home: Medicines  If you were prescribed an antibiotic, take it as told by your health care provider. Do not stop taking the antibiotic even if you start to feel better.  Take over-the-counter and prescription medicines only as told by your health care provider. Lifestyle  Do not use any products that contain nicotine or tobacco, such as cigarettes and e-cigarettes. If you need help quitting, ask your health care provider.  Do not drink alcohol.  Make lifestyle changes as directed by your health care provider. These may include: ? Getting regular exercise. Ask your health care provider to suggest some activities that are safe for you. ? Eating a heart-healthy diet. A registered dietitian can help you to learn healthy eating options. ? Maintaining a healthy weight. ? Managing diabetes, if necessary. ? Reducing stress, such as with yoga or relaxation techniques. General instructions  Avoid any activities that bring on chest pain.  If heartburn is the cause for your chest pain, raise (elevate) the head of your bed about 6 inches (15 cm) by putting blocks under the legs. Sleeping with more pillows does not effectively relieve heartburn because it only changes the position of your head.  Keep all follow-up visits as told by your health care provider. This is important.  This includes any further testing if your chest pain does not go away. Contact a health care provider if:  Your chest pain does not go away.  You have a rash with blisters on your chest.  You have a fever.  You have chills. Get help right away if:  Your chest pain is worse.  You have a cough that gets worse, or you cough up blood.  You have severe pain in your abdomen.  You have severe weakness.  You faint.  You have sudden, unexplained chest discomfort.  You have sudden, unexplained discomfort in your arms, back, neck, or jaw.  You have shortness of breath at any time.  You suddenly start to sweat, or your skin gets clammy.  You feel nauseous or you vomit.  You suddenly feel light-headed or dizzy.  Your heart begins to beat quickly, or it feels like it is skipping beats. These symptoms may represent a serious problem that is an emergency. Do not wait to see if the symptoms will go away. Get medical help right away. Call your local emergency services (911 in the [place] Do not drive yourself to the hospital. This information is not intended to replace advice given to you by your health care provider. Make sure you   discuss any questions you have with your health care provider. Document Released: 11/10/2004 Document Revised: 10/26/2015 Document Reviewed: 10/26/2015 Elsevier Interactive Patient Education  2017 [employer]  

## 2017-08-30 NOTE — Telephone Encounter (Signed)
Patient called in with c/o "chest pain." She says "the pain is under my right breast and extends down to my pelvic area on the right side. This has been ongoing for over a year, but the past few months it has gotten more worrisome to me. Last night the pain was a 8-9, but today it's a 6. I have been exercising, but don't think it is related, I really don't know." I asked about other symptoms, she denies. According to protocol, see PCP within 3 days, appointment scheduled for today at 1640 with Dr. Nolon Rod, care advice given, patient verbalized understanding.   Reason for Disposition . [1] Chest pain(s) lasting a few seconds AND [2] persists > 3 days  Answer Assessment - Initial Assessment Questions 1. LOCATION: "Where does it hurt?"       Under the left breast 2. RADIATION: "Does the pain go anywhere else?" (e.g., into neck, jaw, arms, back)     Moves down left side to pelvic 3. ONSET: "When did the chest pain begin?" (Minutes, hours or days)      April, May; Worse this week 4. PATTERN "Does the pain come and go, or has it been constant since it started?"  "Does it get worse with exertion?"      Comes and goes,  5. DURATION: "How long does it last" (e.g., seconds, minutes, hours)     Unable to determine, it's always there 6. SEVERITY: "How bad is the pain?"  (e.g., Scale 1-10; mild, moderate, or severe)    - MILD (1-3): doesn't interfere with normal activities     - MODERATE (4-7): interferes with normal activities or awakens from sleep    - SEVERE (8-10): excruciating pain, unable to do any normal activities       6 now; last night 8-9 7. CARDIAC RISK FACTORS: "Do you have any history of heart problems or risk factors for heart disease?" (e.g., prior heart attack, angina; high blood pressure, diabetes, being overweight, high cholesterol, smoking, or strong family history of heart disease)     Risk factors-overweight, smokers; family history of heart disease, high cholesterol 8. PULMONARY  RISK FACTORS: "Do you have any history of lung disease?"  (e.g., blood clots in lung, asthma, emphysema, birth control pills)     Asthma 9. CAUSE: "What do you think is causing the chest pain?"     I don't know 10. OTHER SYMPTOMS: "Do you have any other symptoms?" (e.g., dizziness, nausea, vomiting, sweating, fever, difficulty breathing, cough)       No 11. PREGNANCY: "Is there any chance you are pregnant?" "When was your last menstrual period?"       No  Protocols used: CHEST PAIN-A-AH

## 2017-08-30 NOTE — Progress Notes (Signed)
Chief Complaint  Patient presents with  . intermittent chest pain/ left side of body pulsing in head,     chest pain/breast started last yr and went away now  it's back.  Per pt having some low back pain, pain level 3/10, chest pain 6/10.  No otc meds for pain    HPI  She reports that she was laid off She is getting a small severance and unemployment but is stressed due to these circumstances Patient states that she has been noticing that she has pain with activities like washing dishes or deep breaths Her father had a heart attack She is a smoker.  She reports that she has been 1/2 pack a day tobacco use. She has a history of obesity and asthma but her asthma is well controlled.    Depression screen [facility] 2/9 08/30/2017 08/25/2017 06/12/2017 07/13/2016 06/20/2016  Decreased Interest 3 0 0 0 0  Down, Depressed, Hopeless 2 0 0 0 0  PHQ - 2 Score 5 0 0 0 0  Altered sleeping 3 - - - -  Tired, decreased energy 3 - - - -  Change in appetite 3 - - - -  Feeling bad or failure about yourself  2 - - - -  Trouble concentrating 2 - - - -  Moving slowly or fidgety/restless 2 - - - -  Suicidal thoughts 0 - - - -  PHQ-9 Score 20 - - - -  Difficult doing work/chores Somewhat difficult - - - -      Past Medical History:  Diagnosis Date  . Anxiety   . Depression     Current Outpatient Medications  Medication Sig Dispense Refill  . albuterol (PROVENTIL HFA;VENTOLIN HFA) 108 (90 Base) MCG/ACT inhaler Inhale 2 puffs into the lungs every 4 (four) hours as needed for wheezing or shortness of breath (cough, shortness of breath or wheezing.). 1 Inhaler 1  . cetirizine (ZYRTEC) 10 MG tablet Take 1 tablet (10 mg total) by mouth daily. 90 tablet 3  . doxycycline (VIBRAMYCIN) 100 MG capsule Take 1 capsule (100 mg total) by mouth 2 (two) times daily. 20 capsule 0  . nystatin (MYCOSTATIN/NYSTOP) powder Apply topically 4 (four) times daily. 30 g 0  . ondansetron (ZOFRAN ODT) 8 MG disintegrating tablet Take 1  tablet (8 mg total) by mouth every 8 (eight) hours as needed for nausea or vomiting. 10 tablet 0  . valACYclovir (VALTREX) 1000 MG tablet Take 1 tablet (1,000 mg total) by mouth 2 (two) times daily. 20 tablet 2  . omeprazole (PRILOSEC) 20 MG capsule Take 1 capsule (20 mg total) by mouth daily. 90 capsule 0   No current facility-administered medications for this visit.     Allergies:  Allergies  Allergen Reactions  . Bee Venom Swelling    Other reaction(s): itching /swelling  . Oxycodone Hcl Itching    Other reaction(s): itching  . Sulfa Antibiotics     Other reaction(s): itching Itching, hives    Past Surgical History:  Procedure Laterality Date  . CYST REMOVAL NECK    . KNEE SURGERY Right     Social History   Socioeconomic History  . Marital status: Single    Spouse name: Not on file  . Number of children: 0  . Years of education: Not on file  . Highest education level: Not on file  Occupational History  . Not on file  Social Needs  . Financial resource strain: Not on file  . Food insecurity:  Worry: Not on file    Inability: Not on file  . Transportation needs:    Medical: Not on file    Non-medical: Not on file  Tobacco Use  . Smoking status: Light Tobacco Smoker    Types: Cigarettes  . Smokeless tobacco: Never Used  . Tobacco comment: socical   Substance and Sexual Activity  . Alcohol use: Yes    Alcohol/week: 12.0 oz    Types: 15 Glasses of wine, 5 Shots of liquor per week  . Drug use: Yes    Types: Marijuana    Comment: used on a weekly basis  . Sexual activity: Yes  Lifestyle  . Physical activity:    Days per week: Not on file    Minutes per session: Not on file  . Stress: Not on file  Relationships  . Social connections:    Talks on phone: Not on file    Gets together: Not on file    Attends religious service: Not on file    Active member of club or organization: Not on file    Attends meetings of clubs or organizations: Not on file     Relationship status: Not on file  Other Topics Concern  . Not on file  Social History Narrative  . Not on file    Family History  Problem Relation Age of Onset  . Diabetes Father   . Depression Father   . Anxiety disorder Father   . Dementia Father   . Anxiety disorder Mother   . Depression Mother   . Schizophrenia Brother   . Alcohol abuse Brother   . Drug abuse Brother   . Depression Brother   . Anxiety disorder Brother      ROS Review of Systems See HPI Constitution: No fevers or chills No malaise No diaphoresis Skin: No rash or itching Eyes: no blurry vision, no double vision GU: no dysuria or hematuria Neuro: no dizziness or headaches all others reviewed and negative   Objective: Vitals:   08/30/17 1713  BP: 112/84  Pulse: 95  Resp: 17  Temp: 99.8 F (37.7 C)  TempSrc: Oral  SpO2: 95%  Weight: 275 lb 6.4 oz (124.9 kg)  Height: 5' 8.31" (1.735 m)     Physical Exam  Constitutional: She is oriented to person, place, and time. She appears well-developed and well-nourished.  HENT:  Head: Normocephalic.  Eyes: Conjunctivae and EOM are normal.  Cardiovascular: Normal rate, regular rhythm and normal heart sounds.  No murmur heard. Pulmonary/Chest: Effort normal and breath sounds normal. No stridor. No respiratory distress. She exhibits tenderness and bony tenderness. She exhibits no mass.    Abdominal: Soft. Bowel sounds are normal. She exhibits no distension and no mass. There is no tenderness. There is no guarding.  Neurological: She is alert and oriented to person, place, and time.  Psychiatric: She has a normal mood and affect. Her behavior is normal. Judgment and thought content normal.   Tenderness over palpation of the costal ridge as well as the stomach and the LUQ the pain writhes in pain, also reproducible pain over the LLSB  ECG -  NSR, with ecg machine read of ischemic changes I did not see twi or st elevation     Assessment and  Plan Ailee was seen today for intermittent chest pain/ left side of body pulsing in head, .  Diagnoses and all orders for this visit:  Other chest pain-  Given that she has some epigastric pain, some ecg  changes, smoking, stress and anxiety would refer patient for stress test before her insurance ends -     EKG 12-Lead -     ECHOCARDIOGRAM STRESS TEST; Future  Gastroesophageal reflux disease without esophagitis- will start omeprazole  Nonspecific abnormal electrocardiogram (ECG) (EKG) -     ECHOCARDIOGRAM STRESS TEST; Future  Adjustment disorder with anxious mood- discussed stress mgmt  Other orders -     omeprazole (PRILOSEC) 20 MG capsule; Take 1 capsule (20 mg total) by mouth daily.     [place]

## 2018-07-11 ENCOUNTER — Other Ambulatory Visit (HOSPITAL_COMMUNITY): Payer: Self-pay | Admitting: *Deleted

## 2018-07-11 DIAGNOSIS — N6320 Unspecified lump in the left breast, unspecified quadrant: Secondary | ICD-10-CM

## 2018-07-12 ENCOUNTER — Encounter: Payer: Self-pay | Admitting: Emergency Medicine

## 2018-07-12 ENCOUNTER — Other Ambulatory Visit: Payer: Self-pay

## 2018-07-12 ENCOUNTER — Ambulatory Visit: Payer: Self-pay | Admitting: Emergency Medicine

## 2018-07-12 VITALS — BP 138/89 | HR 97 | Temp 98.4°F | Resp 14 | Wt 281.6 lb

## 2018-07-12 DIAGNOSIS — L0231 Cutaneous abscess of buttock: Secondary | ICD-10-CM

## 2018-07-12 DIAGNOSIS — Z7689 Persons encountering health services in other specified circumstances: Secondary | ICD-10-CM

## 2018-07-12 DIAGNOSIS — Z0189 Encounter for other specified special examinations: Secondary | ICD-10-CM

## 2018-07-12 DIAGNOSIS — L089 Local infection of the skin and subcutaneous tissue, unspecified: Secondary | ICD-10-CM

## 2018-07-12 MED ORDER — DOXYCYCLINE HYCLATE 100 MG PO CAPS: 100.0000 mg | ORAL_CAPSULE | Freq: Two times a day (BID) | ORAL | 0 refills | Status: AC

## 2018-07-12 NOTE — Progress Notes (Signed)
Sydney Beck 43 y.o.   Chief Complaint  Patient presents with  . Recurrent Skin Infections    cyst in the center of butt cheeks. Have been trying the tea tree oil compress, with hot compress, hot tub soaks. Still having pain. Have of hx of these cyst i this area before. Cyst is not draining, area is imflammed (swollen, red, and painful to touch) started off small then sun 07/08/18 starting getting bad    HISTORY OF PRESENT ILLNESS: This is a 43 y.o. female complaining of infected cyst to right buttock.  HPI   Prior to Admission medications   Medication Sig Start Date End Date Taking? Authorizing Provider  albuterol (PROVENTIL HFA;VENTOLIN HFA) 108 (90 Base) MCG/ACT inhaler Inhale 2 puffs into the lungs every 4 (four) hours as needed for wheezing or shortness of breath (cough, shortness of breath or wheezing.). 08/25/17  Yes Timmothy Euler, [place] D, PA-C  cetirizine (ZYRTEC) 10 MG tablet Take 1 tablet (10 mg total) by mouth daily. 08/25/17  Yes Timmothy Euler, [place] D, PA-C  nystatin (MYCOSTATIN/NYSTOP) powder Apply topically 4 (four) times daily. 08/25/17  Yes Timmothy Euler, [place] D, PA-C  valACYclovir (VALTREX) 1000 MG tablet Take 1 tablet (1,000 mg total) by mouth 2 (two) times daily. 08/25/17  Yes Timmothy Euler, [place] D, PA-C  doxycycline (VIBRAMYCIN) 100 MG capsule Take 1 capsule (100 mg total) by mouth 2 (two) times daily for 7 days. 07/12/18 07/19/18  Horald Pollen, MD  ondansetron (ZOFRAN ODT) 8 MG disintegrating tablet Take 1 tablet (8 mg total) by mouth every 8 (eight) hours as needed for nausea or vomiting. Patient not taking: Reported on 07/12/2018 05/23/17   Jola Schmidt, MD    Allergies  Allergen Reactions  . Bee Venom Swelling    Other reaction(s): itching /swelling  . Oxycodone Hcl Itching    Other reaction(s): itching  . Sulfa Antibiotics     Other reaction(s): itching Itching, hives    Patient Active Problem List   Diagnosis Date Noted  . Herpes simplex vulvovaginitis  06/19/2017  . Acute stress disorder 03/09/2016  . Allergic rhinitis 03/09/2016  . Moderate major depression, single episode ([place]) 03/09/2016  . Eczema of hand 01/20/2016    Past Medical History:  Diagnosis Date  . Anxiety   . Depression     Past Surgical History:  Procedure Laterality Date  . CYST REMOVAL NECK    . KNEE SURGERY Right     Social History   Socioeconomic History  . Marital status: Single    Spouse name: Not on file  . Number of children: 0  . Years of education: Not on file  . Highest education level: Not on file  Occupational History  . Not on file  Social Needs  . Financial resource strain: Not on file  . Food insecurity:    Worry: Not on file    Inability: Not on file  . Transportation needs:    Medical: Not on file    Non-medical: Not on file  Tobacco Use  . Smoking status: Light Tobacco Smoker    Types: Cigarettes  . Smokeless tobacco: Never Used  . Tobacco comment: socical   Substance and Sexual Activity  . Alcohol use: Yes    Alcohol/week: 20.0 standard drinks    Types: 15 Glasses of wine, 5 Shots of liquor per week  . Drug use: Yes    Types: Marijuana    Comment: used on a weekly basis  . Sexual activity: Yes  Lifestyle  . Physical  activity:    Days per week: Not on file    Minutes per session: Not on file  . Stress: Not on file  Relationships  . Social connections:    Talks on phone: Not on file    Gets together: Not on file    Attends religious service: Not on file    Active member of club or organization: Not on file    Attends meetings of clubs or organizations: Not on file    Relationship status: Not on file  . Intimate partner violence:    Fear of current or ex partner: Not on file    Emotionally abused: Not on file    Physically abused: Not on file    Forced sexual activity: Not on file  Other Topics Concern  . Not on file  Social History Narrative  . Not on file    Family History  Problem Relation Age of Onset   . Diabetes Father   . Depression Father   . Anxiety disorder Father   . Dementia Father   . Anxiety disorder Mother   . Depression Mother   . Schizophrenia Brother   . Alcohol abuse Brother   . Drug abuse Brother   . Depression Brother   . Anxiety disorder Brother      Review of Systems  Constitutional: Negative.  Negative for chills and fever.  HENT: Negative for congestion and sore throat.   Respiratory: Negative for cough and shortness of breath.   Cardiovascular: Negative for chest pain and palpitations.  Gastrointestinal: Negative for abdominal pain, nausea and vomiting.  Musculoskeletal: Negative for myalgias.  Skin:       Tender lump to right buttock  Neurological: Negative for dizziness and headaches.      Today's Vitals   07/12/18 1036  BP: 138/89  Pulse: 97  Resp: 14  Temp: 98.4 F (36.9 C)  TempSrc: Oral  SpO2: 100%  Weight: 281 lb 9.6 oz (127.7 kg)   Body mass index is 42.43 kg/m.   Physical Exam Vitals signs reviewed.  Constitutional:      Appearance: She is obese.  Neck:     Musculoskeletal: Normal range of motion.  Cardiovascular:     Rate and Rhythm: Normal rate and regular rhythm.  Pulmonary:     Effort: Pulmonary effort is normal.  Musculoskeletal: Normal range of motion.  Skin:    Capillary Refill: Capillary refill takes less than 2 seconds.     Comments: Right buttock shows tender, erythematous and fluctuant mass.  Neurological:     General: No focal deficit present.     Mental Status: She is alert and oriented to person, place, and time.  Psychiatric:        Mood and Affect: Mood normal.        Behavior: Behavior normal.     Procedure note: Area cleansed with Betadine.  Infiltrated with lidocaine with epi.  Incision made with disposable #11 blade and moderate amount of purulent material drained.  Cultured.  Probed and packed with half-inch gauze.  Patient tolerated procedure well.  No complications.  Area covered with sterile 4 x  4 gauze.  ASSESSMENT & PLAN: Lilo was seen today for recurrent skin infections.  Diagnoses and all orders for this visit:  Abscess of buttock, right -     WOUND CULTURE  Skin infection -     doxycycline (VIBRAMYCIN) 100 MG capsule; Take 1 capsule (100 mg total) by mouth 2 (two) times daily for 7  days.  Encounter for incision and drainage procedure    Patient Instructions       If you have lab work done today you will be contacted with your lab results within the next 2 weeks.  If you have not heard from [place] then please contact us. The fastest way to get your results is to register for My Chart.   IF you received an x-ray today, you will receive an invoice from [facility]. Please contact [facility] at [phone] with questions or concerns regarding your invoice.   IF you received labwork today, you will receive an invoice from [facility]. Please contact [facility] at [phone] with questions or concerns regarding your invoice.   Our billing staff will not be able to assist you with questions regarding bills from these companies.  You will be contacted with the lab results as soon as they are available. The fastest way to get your results is to activate your My Chart account. Instructions are located on the last page of this paperwork. If you have not heard from [place] regarding the results in 2 weeks, please contact this office.      Incision and Drainage, Care After Refer to this sheet in the next few weeks. These instructions provide you with information about caring for yourself after your procedure. Your health care provider may also give you more specific instructions. Your treatment has been planned according to current medical practices, but problems sometimes occur. Call your health care provider if you have any problems or questions after your procedure. What can I expect after the procedure? After the procedure, it is common to have:  Pain or  discomfort around your incision site.  Drainage from your incision. Follow these instructions at home:  Take over-the-counter and prescription medicines only as told by your health care provider.  If you were prescribed an antibiotic medicine, take it as told by your health care provider.Do not stop taking the antibiotic even if you start to feel better.  Followinstructions from your health care provider about: ? How to take care of your incision. ? When and how you should change your packing and bandage (dressing). Wash your hands with soap and water before you change your dressing. If soap and water are not available, use hand sanitizer. ? When you should remove your dressing.  Do not take baths, swim, or use a hot tub until your health care provider approves.  Keep all follow-up visits as told by your health care provider. This is important.  Check your incision area every day for signs of infection. Check for: ? More redness, swelling, or pain. ? More fluid or blood. ? Warmth. ? Pus or a bad smell. Contact a health care provider if:  Your cyst or abscess returns.  You have a fever.  You have more redness, swelling, or pain around your incision.  You have more fluid or blood coming from your incision.  Your incision feels warm to the touch.  You have pus or a bad smell coming from your incision. Get help right away if:  You have severe pain or bleeding.  You cannot eat or drink without vomiting.  You have decreased urine output.  You become short of breath.  You have chest pain.  You cough up blood.  The area where the incision and drainage occurred becomes numb or it tingles. This information is not intended to replace advice given to you by your health care provider. Make sure you discuss any  questions you have with your health care provider. Document Released: 04/25/2011 Document Revised: 07/03/2015 Document Reviewed: 11/21/2014 Elsevier Interactive  Patient Education  2019 Elsevier Inc.      Agustina Caroli, MD [facility]

## 2018-07-12 NOTE — Patient Instructions (Addendum)
   If you have lab work done today you will be contacted with your lab results within the next 2 weeks.  If you have not heard from us then please contact us. The fastest way to get your results is to register for My Chart.   IF you received an x-ray today, you will receive an invoice from [facility]. Please contact [facility] at [phone] with questions or concerns regarding your invoice.   IF you received labwork today, you will receive an invoice from [facility]. Please contact [facility] at [phone] with questions or concerns regarding your invoice.   Our billing staff will not be able to assist you with questions regarding bills from these companies.  You will be contacted with the lab results as soon as they are available. The fastest way to get your results is to activate your My Chart account. Instructions are located on the last page of this paperwork. If you have not heard from us regarding the results in 2 weeks, please contact this office.     Incision and Drainage, Care After Refer to this sheet in the next few weeks. These instructions provide you with information about caring for yourself after your procedure. Your health care provider may also give you more specific instructions. Your treatment has been planned according to current medical practices, but problems sometimes occur. Call your health care provider if you have any problems or questions after your procedure. What can I expect after the procedure? After the procedure, it is common to have:  Pain or discomfort around your incision site.  Drainage from your incision. Follow these instructions at home:  Take over-the-counter and prescription medicines only as told by your health care provider.  If you were prescribed an antibiotic medicine, take it as told by your health care provider.Do not stop taking the antibiotic even if you start to feel better.  Followinstructions from your health  care provider about: ? How to take care of your incision. ? When and how you should change your packing and bandage (dressing). Wash your hands with soap and water before you change your dressing. If soap and water are not available, use hand sanitizer. ? When you should remove your dressing.  Do not take baths, swim, or use a hot tub until your health care provider approves.  Keep all follow-up visits as told by your health care provider. This is important.  Check your incision area every day for signs of infection. Check for: ? More redness, swelling, or pain. ? More fluid or blood. ? Warmth. ? Pus or a bad smell. Contact a health care provider if:  Your cyst or abscess returns.  You have a fever.  You have more redness, swelling, or pain around your incision.  You have more fluid or blood coming from your incision.  Your incision feels warm to the touch.  You have pus or a bad smell coming from your incision. Get help right away if:  You have severe pain or bleeding.  You cannot eat or drink without vomiting.  You have decreased urine output.  You become short of breath.  You have chest pain.  You cough up blood.  The area where the incision and drainage occurred becomes numb or it tingles. This information is not intended to replace advice given to you by your health care provider. Make sure you discuss any questions you have with your health care provider. Document Released: 04/25/2011 Document Revised: 07/03/2015 Document Reviewed: 11/21/2014   Elsevier Interactive Patient Education  2019 [employer] Inc.  

## 2018-07-13 ENCOUNTER — Ambulatory Visit: Payer: Self-pay | Admitting: *Deleted

## 2018-07-13 ENCOUNTER — Encounter: Payer: Self-pay | Admitting: Registered Nurse

## 2018-07-13 ENCOUNTER — Ambulatory Visit (INDEPENDENT_AMBULATORY_CARE_PROVIDER_SITE_OTHER): Payer: Self-pay | Admitting: Registered Nurse

## 2018-07-13 ENCOUNTER — Other Ambulatory Visit: Payer: Self-pay

## 2018-07-13 VITALS — BP 112/78 | HR 78 | Temp 98.0°F | Resp 16 | Ht 68.0 in | Wt 281.0 lb

## 2018-07-13 DIAGNOSIS — Z5189 Encounter for other specified aftercare: Secondary | ICD-10-CM

## 2018-07-13 NOTE — Progress Notes (Signed)
Acute Office Visit  Subjective:    Patient ID: Sydney Beck, female    DOB: 1975-07-04, 43 y.o.   MRN: [id number]  Chief Complaint  Patient presents with  . Wound Check    pt needs new dressing old one came off.     HPI Patient is in today for wound check.   Seen by Dr. Mitchel Honour on 07/12/18 for I&D, presents today with concern for dressing. Due to the nature of the wound, which is on the intragluteal cleft near the anus, she is concerned regarding defecation and potential infection of the wound. She reports when redressing the wound, she was concerned for drainage and foul odor.   Past Medical History:  Diagnosis Date  . Anxiety   . Depression     Past Surgical History:  Procedure Laterality Date  . CYST REMOVAL NECK    . KNEE SURGERY Right     Family History  Problem Relation Age of Onset  . Diabetes Father   . Depression Father   . Anxiety disorder Father   . Dementia Father   . Anxiety disorder Mother   . Depression Mother   . Schizophrenia Brother   . Alcohol abuse Brother   . Drug abuse Brother   . Depression Brother   . Anxiety disorder Brother     Social History   Socioeconomic History  . Marital status: Single    Spouse name: Not on file  . Number of children: 0  . Years of education: Not on file  . Highest education level: Not on file  Occupational History  . Not on file  Social Needs  . Financial resource strain: Not on file  . Food insecurity:    Worry: Not on file    Inability: Not on file  . Transportation needs:    Medical: Not on file    Non-medical: Not on file  Tobacco Use  . Smoking status: Light Tobacco Smoker    Types: Cigarettes  . Smokeless tobacco: Never Used  . Tobacco comment: socical   Substance and Sexual Activity  . Alcohol use: Yes    Alcohol/week: 20.0 standard drinks    Types: 15 Glasses of wine, 5 Shots of liquor per week  . Drug use: Yes    Types: Marijuana    Comment: used on a weekly basis  . Sexual  activity: Yes  Lifestyle  . Physical activity:    Days per week: Not on file    Minutes per session: Not on file  . Stress: Not on file  Relationships  . Social connections:    Talks on phone: Not on file    Gets together: Not on file    Attends religious service: Not on file    Active member of club or organization: Not on file    Attends meetings of clubs or organizations: Not on file    Relationship status: Not on file  . Intimate partner violence:    Fear of current or ex partner: Not on file    Emotionally abused: Not on file    Physically abused: Not on file    Forced sexual activity: Not on file  Other Topics Concern  . Not on file  Social History Narrative  . Not on file    Outpatient Medications Prior to Visit  Medication Sig Dispense Refill  . albuterol (PROVENTIL HFA;VENTOLIN HFA) 108 (90 Base) MCG/ACT inhaler Inhale 2 puffs into the lungs every 4 (four) hours as needed for wheezing  or shortness of breath (cough, shortness of breath or wheezing.). 1 Inhaler 1  . cetirizine (ZYRTEC) 10 MG tablet Take 1 tablet (10 mg total) by mouth daily. 90 tablet 3  . doxycycline (VIBRAMYCIN) 100 MG capsule Take 1 capsule (100 mg total) by mouth 2 (two) times daily for 7 days. 14 capsule 0  . nystatin (MYCOSTATIN/NYSTOP) powder Apply topically 4 (four) times daily. 30 g 0  . ondansetron (ZOFRAN ODT) 8 MG disintegrating tablet Take 1 tablet (8 mg total) by mouth every 8 (eight) hours as needed for nausea or vomiting. 10 tablet 0  . valACYclovir (VALTREX) 1000 MG tablet Take 1 tablet (1,000 mg total) by mouth 2 (two) times daily. 20 tablet 2   No facility-administered medications prior to visit.     Allergies  Allergen Reactions  . Bee Venom Swelling    Other reaction(s): itching /swelling  . Oxycodone Hcl Itching    Other reaction(s): itching  . Sulfa Antibiotics     Other reaction(s): itching Itching, hives    Review of Systems  Constitutional: Negative.   HENT: Negative.    Eyes: Negative.   Respiratory: Negative.   Cardiovascular: Negative.   Gastrointestinal: Negative.   Genitourinary: Negative.   Musculoskeletal: Negative.   Skin:       Wound as per HPI  Neurological: Negative.   Endo/Heme/Allergies: Negative.   Psychiatric/Behavioral: Negative.   All other systems reviewed and are negative.      Objective:    Physical Exam  Constitutional: She is oriented to person, place, and time. She appears well-developed and well-nourished.  Cardiovascular: Normal rate and regular rhythm.  Pulmonary/Chest: Effort normal.  Musculoskeletal: Normal range of motion.  Neurological: She is alert and oriented to person, place, and time.  Skin: Skin is warm and dry.     Psychiatric: She has a normal mood and affect. Her behavior is normal. Judgment and thought content normal.    BP 112/78   Pulse 78   Temp 98 F (36.7 C) (Oral)   Resp 16   Ht 5\' 8"  (1.727 m)   Wt 281 lb (127.5 kg)   LMP 07/08/2018   SpO2 97%   BMI 42.73 kg/m  Wt Readings from Last 3 Encounters:  07/13/18 281 lb (127.5 kg)  07/12/18 281 lb 9.6 oz (127.7 kg)  08/30/17 275 lb 6.4 oz (124.9 kg)    There are no preventive care reminders to display for this patient.  There are no preventive care reminders to display for this patient.   No results found for: TSH Lab Results  Component Value Date   WBC 6.3 05/23/2017   HGB 12.9 05/23/2017   HCT 40.6 05/23/2017   MCV 90.6 05/23/2017   PLT 217 05/23/2017   Lab Results  Component Value Date   NA 139 05/23/2017   K 4.5 05/23/2017   CO2 24 05/23/2017   GLUCOSE 98 05/23/2017   BUN 10 05/23/2017   CREATININE 0.80 05/23/2017   BILITOT 0.7 05/23/2017   ALKPHOS 52 05/23/2017   AST 22 05/23/2017   ALT 17 05/23/2017   PROT 7.3 05/23/2017   ALBUMIN 3.4 (L) 05/23/2017   CALCIUM 9.4 05/23/2017   ANIONGAP 8 05/23/2017   Lab Results  Component Value Date   CHOL 160 03/09/2016   Lab Results  Component Value Date   HDL 59  03/09/2016   Lab Results  Component Value Date   LDLCALC 72 03/09/2016   Lab Results  Component Value Date   TRIG  146 03/09/2016   Lab Results  Component Value Date   CHOLHDL 2.7 03/09/2016   Lab Results  Component Value Date   HGBA1C 5.6 03/09/2016       Assessment & Plan:   Problem List Items Addressed This Visit    None    Visit Diagnoses    Wound check, abscess    -  Primary       No orders of the defined types were placed in this encounter.  PLAN:  Discussed caring for this wound: change dressing daily or more often with clean gauze and tegaderm cover - because tegaderm forms a seal around the entire wound, we are hopeful that she will be less anxious regarding defecation with this wound. Clean with cool water if needed. Monitor for further signs of infection including increased pain, fever, purulent discharge, foul odor.  Follow up on Monday with Dr. Mitchel Honour for wound check and potentially unpacking the wound.  Patient encouraged to call clinic with any questions, comments, or concerns.    Maximiano Coss, NP

## 2018-07-13 NOTE — Patient Instructions (Signed)
° ° ° °  If you have lab work done today you will be contacted with your lab results within the next 2 weeks.  If you have not heard from us then please contact us. The fastest way to get your results is to register for My Chart. ° ° °IF you received an x-ray today, you will receive an invoice from [facility]. Please contact [facility] at [phone] with questions or concerns regarding your invoice.  ° °IF you received labwork today, you will receive an invoice from [facility]. Please contact [facility] at [phone] with questions or concerns regarding your invoice.  ° °Our billing staff will not be able to assist you with questions regarding bills from these companies. ° °You will be contacted with the lab results as soon as they are available. The fastest way to get your results is to activate your My Chart account. Instructions are located on the last page of this paperwork. If you have not heard from us regarding the results in 2 weeks, please contact this office. °  ° ° ° °

## 2018-07-13 NOTE — Telephone Encounter (Signed)
Pt called stating that she abscess drained on 07/12/2018; she states that when she uses the bathroom the bandage is coming off, and saturated; she would like to know if she can change it on her on; she is due to return to the office on 07/16/2018; she says that the pain is less that it was on 07/12/2018, but she is concerned about the location and condition of the bandage and package; contacted Barnett Applebaum at [facility]; she requests that this information be forwarded to the office for provider's review and recommendations; someone will call the pt back; pt notified and verbalizes understanding; she can  Be contacted at [phone], and a message can be left on the voicemail; will route per request.   Reason for Disposition . [1] Taking antibiotic < 72 hours (3 days) AND [2] infected wound doesn't look better  Answer Assessment - Initial Assessment Questions 1. LOCATION: "Where is the wound located?"      Between buttock chhes 2. WOUND APPEARANCE: "What does the wound look like?"      Pt can not visualize 3. SIZE: If redness is present, ask: "What is the size of the red area?" (Inches, centimeters, or compare to size of a coin)   pt can not visualize   4. SPREAD: "What's changed in the last day?"  "Do you see any red streaks coming from the wound?"     Packing saturated and dressing changed location 5. ONSET: "When did it start to look infected?"       6. MECHANISM: "How did the wound start, what was the cause?"    I&D 5/28 7. PAIN: "Is there any pain?" If so, ask: "How bad is the pain?"   (Scale 1-10; or mild, moderate, severe)      8. FEVER: "Do you have a fever?" If so, ask: "What is your temperature, how was it measured, and when did it start?"    no 9. OTHER SYMPTOMS: "Do you have any other symptoms?" (e.g., shaking chills, weakness, rash elsewhere on body)     no 10. PREGNANCY: "Is there any chance you are pregnant?" "When was your last menstrual period?"  Protocols used: WOUND INFECTION-A-AH

## 2018-07-13 NOTE — Telephone Encounter (Signed)
I have attempted to call pt and there was no answer so I left a message to call back and informed the pt via Vm that the are can be clean and make sure there it is clean and the the packing stays intact as much as possible.   Thanks

## 2018-07-15 LAB — WOUND CULTURE

## 2018-07-16 ENCOUNTER — Ambulatory Visit: Payer: Self-pay | Admitting: Emergency Medicine

## 2018-07-16 ENCOUNTER — Other Ambulatory Visit: Payer: Self-pay

## 2018-07-16 ENCOUNTER — Encounter: Payer: Self-pay | Admitting: Emergency Medicine

## 2018-07-16 VITALS — BP 102/74 | HR 94 | Temp 98.5°F | Resp 18 | Wt 281.0 lb

## 2018-07-16 DIAGNOSIS — A6004 Herpesviral vulvovaginitis: Secondary | ICD-10-CM

## 2018-07-16 DIAGNOSIS — L0231 Cutaneous abscess of buttock: Secondary | ICD-10-CM

## 2018-07-16 DIAGNOSIS — Z09 Encounter for follow-up examination after completed treatment for conditions other than malignant neoplasm: Secondary | ICD-10-CM

## 2018-07-16 MED ORDER — VALACYCLOVIR HCL 1 G PO TABS: 1000.0000 mg | ORAL_TABLET | Freq: Two times a day (BID) | ORAL | 2 refills | Status: DC

## 2018-07-16 NOTE — Progress Notes (Signed)
Sydney Beck 43 y.o.   Chief Complaint  Patient presents with  . Wound Check    follow up abcess    HISTORY OF PRESENT ILLNESS: This is a 43 y.o. female here for follow-up of right buttock abscess, status post incision and drainage.  Doing well.  Has no complaints.  Removed packing at home 2 days ago.  Tolerating antibiotic well.  HPI   Prior to Admission medications   Medication Sig Start Date End Date Taking? Authorizing Provider  albuterol (PROVENTIL HFA;VENTOLIN HFA) 108 (90 Base) MCG/ACT inhaler Inhale 2 puffs into the lungs every 4 (four) hours as needed for wheezing or shortness of breath (cough, shortness of breath or wheezing.). 08/25/17  Yes Timmothy Euler, [place] D, PA-C  cetirizine (ZYRTEC) 10 MG tablet Take 1 tablet (10 mg total) by mouth daily. 08/25/17  Yes Timmothy Euler, [place] D, PA-C  doxycycline (VIBRAMYCIN) 100 MG capsule Take 1 capsule (100 mg total) by mouth 2 (two) times daily for 7 days. 07/12/18 07/19/18 Yes Jazmeen Axtell, Ines Bloomer, MD  ondansetron (ZOFRAN ODT) 8 MG disintegrating tablet Take 1 tablet (8 mg total) by mouth every 8 (eight) hours as needed for nausea or vomiting. 05/23/17  Yes Jola Schmidt, MD  valACYclovir (VALTREX) 1000 MG tablet Take 1 tablet (1,000 mg total) by mouth 2 (two) times daily. 08/25/17  Yes Timmothy Euler, [place] D, PA-C  nystatin (MYCOSTATIN/NYSTOP) powder Apply topically 4 (four) times daily. Patient not taking: Reported on 07/16/2018 08/25/17   [name] D, PA-C    Allergies  Allergen Reactions  . Bee Venom Swelling    Other reaction(s): itching /swelling  . Oxycodone Hcl Itching    Other reaction(s): itching  . Sulfa Antibiotics     Other reaction(s): itching Itching, hives    Patient Active Problem List   Diagnosis Date Noted  . Allergic rhinitis 03/09/2016  . Moderate major depression, single episode ([place]) 03/09/2016    Past Medical History:  Diagnosis Date  . Anxiety   . Depression     Past Surgical History:  Procedure  Laterality Date  . CYST REMOVAL NECK    . KNEE SURGERY Right     Social History   Socioeconomic History  . Marital status: Single    Spouse name: Not on file  . Number of children: 0  . Years of education: Not on file  . Highest education level: Not on file  Occupational History  . Not on file  Social Needs  . Financial resource strain: Not on file  . Food insecurity:    Worry: Not on file    Inability: Not on file  . Transportation needs:    Medical: Not on file    Non-medical: Not on file  Tobacco Use  . Smoking status: Light Tobacco Smoker    Types: Cigarettes  . Smokeless tobacco: Never Used  . Tobacco comment: socical   Substance and Sexual Activity  . Alcohol use: Yes    Alcohol/week: 20.0 standard drinks    Types: 15 Glasses of wine, 5 Shots of liquor per week  . Drug use: Yes    Types: Marijuana    Comment: used on a weekly basis  . Sexual activity: Yes  Lifestyle  . Physical activity:    Days per week: Not on file    Minutes per session: Not on file  . Stress: Not on file  Relationships  . Social connections:    Talks on phone: Not on file    Gets together: Not on file  Attends religious service: Not on file    Active member of club or organization: Not on file    Attends meetings of clubs or organizations: Not on file    Relationship status: Not on file  . Intimate partner violence:    Fear of current or ex partner: Not on file    Emotionally abused: Not on file    Physically abused: Not on file    Forced sexual activity: Not on file  Other Topics Concern  . Not on file  Social History Narrative  . Not on file    Family History  Problem Relation Age of Onset  . Diabetes Father   . Depression Father   . Anxiety disorder Father   . Dementia Father   . Anxiety disorder Mother   . Depression Mother   . Schizophrenia Brother   . Alcohol abuse Brother   . Drug abuse Brother   . Depression Brother   . Anxiety disorder Brother       Review of Systems  Constitutional: Negative.  Negative for chills and fever.  HENT: Negative.  Negative for congestion and sore throat.   Respiratory: Negative for cough and shortness of breath.   Cardiovascular: Negative for chest pain.  Gastrointestinal: Negative.  Negative for abdominal pain, diarrhea, nausea and vomiting.  Skin: Negative.  Negative for rash.  Neurological: Negative for dizziness and headaches.  Endo/Heme/Allergies: Negative.    Vitals:   07/16/18 1423  BP: 102/74  Pulse: 94  Resp: 18  Temp: 98.5 F (36.9 C)  SpO2: 99%     Physical Exam Constitutional:      Appearance: Normal appearance.  HENT:     Head: Normocephalic.  Eyes:     Extraocular Movements: Extraocular movements intact.  Neck:     Musculoskeletal: Normal range of motion.  Cardiovascular:     Rate and Rhythm: Normal rate.  Pulmonary:     Effort: Pulmonary effort is normal.  Musculoskeletal: Normal range of motion.  Skin:    General: Skin is warm and dry.     Comments: Right buttock: Biopsy is healing very well.  Packing was removed by patient 2 days ago.  No drainage.  No swelling or erythema.  Much improved.  Neurological:     General: No focal deficit present.     Mental Status: She is alert and oriented to person, place, and time.  Psychiatric:        Mood and Affect: Mood normal.        Behavior: Behavior normal.    Recent Results (from the past 2160 hour(s))  WOUND CULTURE     Status: Abnormal   Collection Time: 07/12/18 11:47 AM  Result Value Ref Range   Gram Stain Result Final report    Organism ID, Bacteria Comment     Comment: Many white blood cells.   Organism ID, Bacteria Comment     Comment: Few gram negative rods.   Aerobic Bacterial Culture Final report (A)    Organism ID, Bacteria Escherichia coli (A)     Comment: Heavy growth   Antimicrobial Susceptibility Comment     Comment:       ** S = Susceptible; I = Intermediate; R = Resistant **                    P =  Positive; N = Negative             MICS are expressed in micrograms per mL  Antibiotic                 RSLT#1    RSLT#2    RSLT#3    RSLT#4 Amoxicillin/Clavulanic Acid    S Ampicillin                     S Cefepime                       S Ceftriaxone                    S Cefuroxime                     S Ciprofloxacin                  S Ertapenem                      S Gentamicin                     S Imipenem                       S Levofloxacin                   S Meropenem                      S Piperacillin/Tazobactam        S Tetracycline                   S Tobramycin                     S Trimethoprim/Sulfa             S       ASSESSMENT & PLAN: Sydney Beck was seen today for wound check.  Diagnoses and all orders for this visit:  Abscess of buttock, right Comments: Much improved  Encounter for recheck of abscess following incision and drainage  Herpes simplex vulvovaginitis -     valACYclovir (VALTREX) 1000 MG tablet; Take 1 tablet (1,000 mg total) by mouth 2 (two) times daily.    Patient Instructions  Incision and Drainage, Care After Refer to this sheet in the next few weeks. These instructions provide you with information about caring for yourself after your procedure. Your health care provider may also give you more specific instructions. Your treatment has been planned according to current medical practices, but problems sometimes occur. Call your health care provider if you have any problems or questions after your procedure. What can I expect after the procedure? After the procedure, it is common to have:  Pain or discomfort around your incision site.  Drainage from your incision. Follow these instructions at home:  Take over-the-counter and prescription medicines only as told by your health care provider.  If you were prescribed an antibiotic medicine, take it as told by your health care provider.Do not stop taking the antibiotic even if you start to  feel better.  Followinstructions from your health care provider about: ? How to take care of your incision. ? When and how you should change your packing and bandage (dressing). Wash your hands with soap and water before you change your dressing. If soap and water are not available, use hand sanitizer. ? When you should remove your dressing.  Do not take  baths, swim, or use a hot tub until your health care provider approves.  Keep all follow-up visits as told by your health care provider. This is important.  Check your incision area every day for signs of infection. Check for: ? More redness, swelling, or pain. ? More fluid or blood. ? Warmth. ? Pus or a bad smell. Contact a health care provider if:  Your cyst or abscess returns.  You have a fever.  You have more redness, swelling, or pain around your incision.  You have more fluid or blood coming from your incision.  Your incision feels warm to the touch.  You have pus or a bad smell coming from your incision. Get help right away if:  You have severe pain or bleeding.  You cannot eat or drink without vomiting.  You have decreased urine output.  You become short of breath.  You have chest pain.  You cough up blood.  The area where the incision and drainage occurred becomes numb or it tingles. This information is not intended to replace advice given to you by your health care provider. Make sure you discuss any questions you have with your health care provider. Document Released: 04/25/2011 Document Revised: 07/03/2015 Document Reviewed: 11/21/2014 Elsevier Interactive Patient Education  2019 Elsevier Inc.      Agustina Caroli, MD [facility]

## 2018-07-16 NOTE — Patient Instructions (Signed)
Incision and Drainage, Care After  Refer to this sheet in the next few weeks. These instructions provide you with information about caring for yourself after your procedure. Your health care provider may also give you more specific instructions. Your treatment has been planned according to current medical practices, but problems sometimes occur. Call your health care provider if you have any problems or questions after your procedure.  What can I expect after the procedure?  After the procedure, it is common to have:  · Pain or discomfort around your incision site.  · Drainage from your incision.  Follow these instructions at home:  · Take over-the-counter and prescription medicines only as told by your health care provider.  · If you were prescribed an antibiotic medicine, take it as told by your health care provider. Do not stop taking the antibiotic even if you start to feel better.  · Follow instructions from your health care provider about:  ? How to take care of your incision.  ? When and how you should change your packing and bandage (dressing). Wash your hands with soap and water before you change your dressing. If soap and water are not available, use hand sanitizer.  ? When you should remove your dressing.  · Do not take baths, swim, or use a hot tub until your health care provider approves.  · Keep all follow-up visits as told by your health care provider. This is important.  · Check your incision area every day for signs of infection. Check for:  ? More redness, swelling, or pain.  ? More fluid or blood.  ? Warmth.  ? Pus or a bad smell.  Contact a health care provider if:  · Your cyst or abscess returns.  · You have a fever.  · You have more redness, swelling, or pain around your incision.  · You have more fluid or blood coming from your incision.  · Your incision feels warm to the touch.  · You have pus or a bad smell coming from your incision.  Get help right away if:  · You have severe pain or  bleeding.  · You cannot eat or drink without vomiting.  · You have decreased urine output.  · You become short of breath.  · You have chest pain.  · You cough up blood.  · The area where the incision and drainage occurred becomes numb or it tingles.  This information is not intended to replace advice given to you by your health care provider. Make sure you discuss any questions you have with your health care provider.  Document Released: 04/25/2011 Document Revised: 07/03/2015 Document Reviewed: 11/21/2014  Elsevier Interactive Patient Education © 2019 Elsevier Inc.

## 2018-07-24 ENCOUNTER — Other Ambulatory Visit: Payer: Self-pay

## 2018-07-24 ENCOUNTER — Ambulatory Visit (HOSPITAL_COMMUNITY)
Admission: RE | Admit: 2018-07-24 | Discharge: 2018-07-24 | Disposition: A | Discharge status: Home / Self Care | Payer: Self-pay | Source: Ambulatory Visit | Attending: Obstetrics and Gynecology | Admitting: Obstetrics and Gynecology

## 2018-07-24 ENCOUNTER — Ambulatory Visit
Admission: RE | Admit: 2018-07-24 | Discharge: 2018-07-24 | Disposition: A | Discharge status: Home / Self Care | Payer: No Typology Code available for payment source | Source: Ambulatory Visit | Attending: Obstetrics and Gynecology | Admitting: Obstetrics and Gynecology

## 2018-07-24 ENCOUNTER — Encounter (HOSPITAL_COMMUNITY): Payer: Self-pay

## 2018-07-24 ENCOUNTER — Ambulatory Visit
Admission: RE | Admit: 2018-07-24 | Discharge: 2018-07-24 | Disposition: A | Discharge status: Home / Self Care | Payer: BLUE CROSS/BLUE SHIELD | Source: Ambulatory Visit | Attending: Obstetrics and Gynecology | Admitting: Obstetrics and Gynecology

## 2018-07-24 VITALS — BP 136/86 | Temp 98.3°F | Wt 281.0 lb

## 2018-07-24 DIAGNOSIS — N644 Mastodynia: Secondary | ICD-10-CM

## 2018-07-24 DIAGNOSIS — N6320 Unspecified lump in the left breast, unspecified quadrant: Secondary | ICD-10-CM

## 2018-07-24 DIAGNOSIS — Z1239 Encounter for other screening for malignant neoplasm of breast: Secondary | ICD-10-CM

## 2018-07-24 NOTE — Progress Notes (Signed)
Complaints of left breast lump and left inner breast pain x one year. Patient states the pain comes and goes. Patient rates the pain at a 6 out of 10.  Pap Smear: Pap smear not completed today. Last Pap smear was in November 2018 at [facility] and normal per patient. Per patient has no history of an abnormal Pap smear. No Pap smear results are in Epic.  Physical exam: Breasts Breasts symmetrical. No skin abnormalities bilateral breasts. No nipple retraction bilateral breasts. No nipple discharge bilateral breasts. No lymphadenopathy. No lumps palpated bilateral breasts. Unable to palpate a lump in patients area of concern. Complaints of left outer and left lower inner breast pain on exam. Referred patient to the [facility] for a diagnostic mammogram. Appointment scheduled for Tuesday, July 24, 2018 at 1510.        Pelvic/Bimanual No Pap smear completed today since last Pap smear was in November 2018 per patient. Pap smear not indicated per BCCCP guidelines.   Smoking History: Patient is a current smoker. Discussed smoking cessation with patient. Referred to the [facility] Quitline and gave information about the free smoking cessation classes offered at [facility].  Patient Navigation: Patient education provided. Access to services provided for patient through BCCCP program.   Breast and Cervical Cancer Risk Assessment: Patient has no family history of breast cancer, known genetic mutations, or radiation treatment to the chest before age 27. Patient has no history of cervical dysplasia, immunocompromised, or DES exposure in-utero.  Risk Assessment    Risk Scores      07/24/2018   Last edited by: Armond Hang, LPN   5-year risk: 0.5 %   Lifetime risk: 7.5 %

## 2018-07-24 NOTE — Patient Instructions (Addendum)
Explained breast self awareness with [facility]. Patient did not need a Pap smear today due to last Pap smear was in November 2018 per patient. Let her know [facility] will cover Pap smears every 3 years unless has a history of abnormal Pap smears. Referred patient to the [place] for a diagnostic mammogram. Appointment scheduled for Tuesday, July 24, 2018 at 1510. Patient aware of appointment and will be there. Discussed smoking cessation with patient. Referred to the [facility] Quitline and gave information about the free smoking cessation classes offered at [facility]. [name] verbalized understanding.  Bailie Christenbury, Arvil Chaco, RN 3:04 PM

## 2018-07-25 ENCOUNTER — Encounter (HOSPITAL_COMMUNITY): Payer: Self-pay | Admitting: *Deleted

## 2018-08-23 ENCOUNTER — Other Ambulatory Visit: Payer: Self-pay | Admitting: Emergency Medicine

## 2018-08-23 DIAGNOSIS — L089 Local infection of the skin and subcutaneous tissue, unspecified: Secondary | ICD-10-CM

## 2018-08-23 NOTE — Telephone Encounter (Signed)
Please advise 

## 2018-08-28 DIAGNOSIS — Z20828 Contact with and (suspected) exposure to other viral communicable diseases: Secondary | ICD-10-CM | POA: Diagnosis not present

## 2019-05-30 ENCOUNTER — Telehealth: Payer: Self-pay | Admitting: Adult Health Nurse Practitioner

## 2019-05-30 NOTE — Telephone Encounter (Signed)
05/30/2019 - PATIENT HAS A COMPLETE PHYSICAL SCHEDULED WITH SARAH BYRD ON Friday (06/14/2019). OUR NEW POLICY FOR PHYSICALS IS TO SCHEDULE A NURSE'S (LAB) APPOINTMENT A FEW DAYS BEFORE. I TRIED TO CALL AND SCHEDULE BUT HER MAILBOX IS FULL AND NOT EXCEPTING ANY MESSAGES. [place]

## 2019-06-04 ENCOUNTER — Telehealth (INDEPENDENT_AMBULATORY_CARE_PROVIDER_SITE_OTHER): Payer: BC Managed Care – PPO | Admitting: Adult Health Nurse Practitioner

## 2019-06-04 ENCOUNTER — Encounter: Payer: Self-pay | Admitting: Adult Health Nurse Practitioner

## 2019-06-04 ENCOUNTER — Other Ambulatory Visit: Payer: Self-pay

## 2019-06-04 VITALS — Wt 273.0 lb

## 2019-06-04 DIAGNOSIS — J45909 Unspecified asthma, uncomplicated: Secondary | ICD-10-CM | POA: Diagnosis not present

## 2019-06-04 DIAGNOSIS — Z20822 Contact with and (suspected) exposure to covid-19: Secondary | ICD-10-CM

## 2019-06-04 MED ORDER — ALBUTEROL SULFATE HFA 108 (90 BASE) MCG/ACT IN AERS: 2.0000 | INHALATION_SPRAY | RESPIRATORY_TRACT | 1 refills | Status: AC | PRN

## 2019-06-04 NOTE — Patient Instructions (Signed)
° ° ° °  If you have lab work done today you will be contacted with your lab results within the next 2 weeks.  If you have not heard from us then please contact us. The fastest way to get your results is to register for My Chart. ° ° °IF you received an x-ray today, you will receive an invoice from [facility]. Please contact [facility] at [phone] with questions or concerns regarding your invoice.  ° °IF you received labwork today, you will receive an invoice from [facility]. Please contact [facility] at [phone] with questions or concerns regarding your invoice.  ° °Our billing staff will not be able to assist you with questions regarding bills from these companies. ° °You will be contacted with the lab results as soon as they are available. The fastest way to get your results is to activate your My Chart account. Instructions are located on the last page of this paperwork. If you have not heard from us regarding the results in 2 weeks, please contact this office. °  ° ° ° °

## 2019-06-04 NOTE — Progress Notes (Signed)
Virtual Visit via Video Note  I connected with Sydney Beck on 06/04/19 at  2:50 PM EDT by a video enabled telemedicine application and verified that I am speaking with the correct person using two identifiers.  Location: Patient: home Provider: [facility]   I discussed the limitations of evaluation and management by telemedicine and the availability of in person appointments. The patient expressed understanding and agreed to proceed.  History of Present Illness: Patient presents with a history of cold, cough, fatigue since Sunday.  States she can feel rattling in her chest when she wakes up in the morning.  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  People have been sick.  She has not gone the Covid vaccination and she does not plan to.   Observations/Objective:  General appearance: oriented to person, place, and time and ill-appearing.  Assessment and Plan:  1. Encounter by telehealth for suspected COVID-19   2. Asthma due to seasonal allergies     Follow Up Instructions: Orders Placed This Encounter  Procedures  . Novel Coronavirus, NAA ([facility])   Meds ordered this encounter  Medications  . albuterol (VENTOLIN HFA) 108 (90 Base) MCG/ACT inhaler    Sig: Inhale 2 puffs into the lungs every 4 (four) hours as needed for wheezing or shortness of breath (cough, shortness of breath or wheezing.).    Dispense:  6.7 g    Refill:  1  She was sent a work note and asked to follow all quarantine instructions until we have a Covid test that is negative.  She verbalized understanding.  She should not come for labs tomorrow that we have ordered here at [place].  She understands.  We will follow-up once testing returns.  She is in line with this plan.    I discussed the assessment and treatment plan with the patient. The patient was provided an opportunity to ask questions and all were answered. The patient agreed with the plan and demonstrated an  understanding of the instructions.   The patient was advised to call back or seek an in-person evaluation if the symptoms worsen or if the condition fails to improve as anticipated.  I provided 10 minutes of non-face-to-face time during this encounter.   Glyn Ade, NP

## 2019-06-05 ENCOUNTER — Ambulatory Visit: Payer: Self-pay

## 2019-06-05 ENCOUNTER — Ambulatory Visit: Payer: No Typology Code available for payment source | Attending: Internal Medicine

## 2019-06-05 DIAGNOSIS — Z20822 Contact with and (suspected) exposure to covid-19: Secondary | ICD-10-CM | POA: Insufficient documentation

## 2019-06-06 LAB — NOVEL CORONAVIRUS, NAA: SARS-CoV-2, NAA: NOT DETECTED

## 2019-06-06 LAB — SARS-COV-2, NAA 2 DAY TAT

## 2019-06-07 ENCOUNTER — Telehealth (INDEPENDENT_AMBULATORY_CARE_PROVIDER_SITE_OTHER): Payer: BC Managed Care – PPO | Admitting: Adult Health Nurse Practitioner

## 2019-06-07 ENCOUNTER — Other Ambulatory Visit: Payer: Self-pay

## 2019-06-07 ENCOUNTER — Ambulatory Visit: Payer: Self-pay

## 2019-06-07 ENCOUNTER — Encounter: Payer: Self-pay | Admitting: Adult Health Nurse Practitioner

## 2019-06-07 VITALS — Temp 97.4°F | Ht 69.0 in | Wt 275.0 lb

## 2019-06-07 DIAGNOSIS — J301 Allergic rhinitis due to pollen: Secondary | ICD-10-CM

## 2019-06-07 DIAGNOSIS — R112 Nausea with vomiting, unspecified: Secondary | ICD-10-CM

## 2019-06-07 NOTE — Patient Instructions (Signed)
° ° ° °  If you have lab work done today you will be contacted with your lab results within the next 2 weeks.  If you have not heard from us then please contact us. The fastest way to get your results is to register for My Chart. ° ° °IF you received an x-ray today, you will receive an invoice from [facility]. Please contact [facility] at [phone] with questions or concerns regarding your invoice.  ° °IF you received labwork today, you will receive an invoice from [facility]. Please contact [facility] at [phone] with questions or concerns regarding your invoice.  ° °Our billing staff will not be able to assist you with questions regarding bills from these companies. ° °You will be contacted with the lab results as soon as they are available. The fastest way to get your results is to activate your My Chart account. Instructions are located on the last page of this paperwork. If you have not heard from us regarding the results in 2 weeks, please contact this office. °  ° ° ° °

## 2019-06-11 DIAGNOSIS — R112 Nausea with vomiting, unspecified: Secondary | ICD-10-CM | POA: Insufficient documentation

## 2019-06-11 NOTE — Progress Notes (Signed)
THIS WAS A VIDEO ENCOUNTER. 10 MINUTES This telephone encounter was conducted with the patient's (or proxy's) verbal consent via audio telecommunications: yes/no: Yes Patient was instructed to have this encounter in a suitably private space; and to only have persons present to whom they give permission to participate. In addition, patient identity was confirmed by use of name plus two identifiers (DOB and address).  I discussed the limitations, risks, security and privacy concerns of performing an evaluation and management service by telephone and the availability of in person appointments. I also discussed with the patient that there may be a patient responsible charge related to this service. The patient expressed understanding and agreed to proceed.  I spent a total of TIME; 0 MIN TO 60 MIN: 10 minutes talking with the patient or their proxy.  Chief Complaint  Patient presents with  . 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 Diarrhea    worst last saturday. 06/01/19 (feeling bad for the last 2 weeks)  . cold sweat  . SHOB    Subjective   Sydney Beck is a 44 y.o. established patient. Telephone visit today for viral symptoms.   HPI  Patient follows up for virtual visit explaining she is still having symptoms from her GI illness that will prevent her from going to work.  Her Covid was negative and she requests a note to return on Tuesday as that is when she next works. She also communicated she started her period and was tired.  She again questioned whether a sexual encounter over a month ago could have caused these symptoms and I responded that was unlikely but when she is no longer ill, she can come to the clinic to be screened for any STI.   Also, explained I would not be able to write for another work note without her being seen in person for her medical problem.   Patient Active Problem List   Diagnosis Date Noted  . Allergic rhinitis 03/09/2016  . Moderate  major depression, single episode ([place]) 03/09/2016    Past Medical History:  Diagnosis Date  . Anxiety   . Depression     Current Outpatient Medications  Medication Sig Dispense Refill  . albuterol (VENTOLIN HFA) 108 (90 Base) MCG/ACT inhaler Inhale 2 puffs into the lungs every 4 (four) hours as needed for wheezing or shortness of breath (cough, shortness of breath or wheezing.). 6.7 g 1  . cetirizine (ZYRTEC) 10 MG tablet Take 1 tablet (10 mg total) by mouth daily. 90 tablet 3  . nystatin (MYCOSTATIN/NYSTOP) powder Apply topically 4 (four) times daily. 30 g 0  . ondansetron (ZOFRAN ODT) 8 MG disintegrating tablet Take 1 tablet (8 mg total) by mouth every 8 (eight) hours as needed for nausea or vomiting. 10 tablet 0  . valACYclovir (VALTREX) 1000 MG tablet Take 1 tablet (1,000 mg total) by mouth 2 (two) times daily. 20 tablet 2   No current facility-administered medications for this visit.    Allergies  Allergen Reactions  . Bee Venom Swelling    Other reaction(s): itching /swelling  . Oxycodone Hcl Itching    Other reaction(s): itching  . Sulfa Antibiotics     Other reaction(s): itching Itching, hives    Social History   Socioeconomic History  . Marital status: Single    Spouse name: Not on file  . Number of children: 0  . Years of education: Not on file  . Highest education level:  Some college, no degree  Occupational History  . Not on file  Tobacco Use  . Smoking status: Light Tobacco Smoker    Types: Cigarettes  . Smokeless tobacco: Never Used  . Tobacco comment: socical   Substance and Sexual Activity  . Alcohol use: Yes    Alcohol/week: 20.0 standard drinks    Types: 15 Glasses of wine, 5 Shots of liquor per week  . Drug use: Yes    Types: Marijuana    Comment: used on a weekly basis  . Sexual activity: Yes  Other Topics Concern  . Not on file  Social History Narrative  . Not on file   Social Determinants of Health   Financial Resource Strain:   .  Difficulty of Paying Living Expenses:   Food Insecurity:   . Worried About Charity fundraiser in the Last Year:   . Arboriculturist in the Last Year:   Transportation Needs: No Transportation Needs  . Lack of Transportation (Medical): No  . Lack of Transportation (Non-Medical): No  Physical Activity:   . Days of Exercise per Week:   . Minutes of Exercise per Session:   Stress:   . Feeling of Stress :   Social Connections:   . Frequency of Communication with Friends and Family:   . Frequency of Social Gatherings with Friends and Family:   . Attends Religious Services:   . Active Member of Clubs or Organizations:   . Attends Archivist Meetings:   Marland Kitchen Marital Status:   Intimate Partner Violence:   . Fear of Current or Ex-Partner:   . Emotionally Abused:   Marland Kitchen Physically Abused:   . Sexually Abused:     Review of Systems  Constitutional: Positive for chills, diaphoresis and malaise/fatigue.  HENT: Positive for congestion.   Gastrointestinal: Positive for diarrhea and nausea.  Musculoskeletal: Positive for myalgias.  Neurological: Positive for dizziness and headaches.    Objective     GEN: WDWN, NAD, Non-toxic, Alert & Oriented x 3  PSYCH: Normally interactive. Conversant. Not depressed or anxious appearing.  Calm demeanor.   Vitals as reported by the patient: Today's Vitals   06/07/19 1414  Temp: (!) 97.4 F (36.3 C)  TempSrc: Oral  Weight: 275 lb (124.7 kg)  Height: 5\' 9"  (1.753 m)    There are no diagnoses linked to this encounter.   I discussed the assessment and treatment plan with the patient. The patient was provided an opportunity to ask questions and all were answered. The patient agreed with the plan and demonstrated an understanding of the instructions.   The patient was advised to call back or seek an in-person evaluation if the symptoms worsen or if the condition fails to improve as anticipated.  I provided 10 minutes of non-face-to-face time  during this encounter.  Glyn Ade, NP  Primary Care at [facility]

## 2019-06-12 DIAGNOSIS — Z6841 Body Mass Index (BMI) 40.0 and over, adult: Secondary | ICD-10-CM | POA: Diagnosis not present

## 2019-06-12 DIAGNOSIS — Z1231 Encounter for screening mammogram for malignant neoplasm of breast: Secondary | ICD-10-CM | POA: Diagnosis not present

## 2019-06-12 DIAGNOSIS — Z01419 Encounter for gynecological examination (general) (routine) without abnormal findings: Secondary | ICD-10-CM | POA: Diagnosis not present

## 2019-06-12 DIAGNOSIS — Z1151 Encounter for screening for human papillomavirus (HPV): Secondary | ICD-10-CM | POA: Diagnosis not present

## 2019-06-12 DIAGNOSIS — Z1159 Encounter for screening for other viral diseases: Secondary | ICD-10-CM | POA: Diagnosis not present

## 2019-06-12 DIAGNOSIS — B373 Candidiasis of vulva and vagina: Secondary | ICD-10-CM | POA: Diagnosis not present

## 2019-06-12 DIAGNOSIS — Z113 Encounter for screening for infections with a predominantly sexual mode of transmission: Secondary | ICD-10-CM | POA: Diagnosis not present

## 2019-06-12 DIAGNOSIS — Z114 Encounter for screening for human immunodeficiency virus [HIV]: Secondary | ICD-10-CM | POA: Diagnosis not present

## 2019-06-14 ENCOUNTER — Other Ambulatory Visit: Payer: Self-pay

## 2019-06-14 ENCOUNTER — Ambulatory Visit (INDEPENDENT_AMBULATORY_CARE_PROVIDER_SITE_OTHER): Payer: BC Managed Care – PPO | Admitting: Adult Health Nurse Practitioner

## 2019-06-14 VITALS — BP 133/91 | HR 87 | Temp 97.6°F | Ht 68.0 in | Wt 276.4 lb

## 2019-06-14 DIAGNOSIS — Z0001 Encounter for general adult medical examination with abnormal findings: Secondary | ICD-10-CM

## 2019-06-14 DIAGNOSIS — Z0000 Encounter for general adult medical examination without abnormal findings: Secondary | ICD-10-CM

## 2019-06-14 DIAGNOSIS — R112 Nausea with vomiting, unspecified: Secondary | ICD-10-CM | POA: Diagnosis not present

## 2019-06-14 DIAGNOSIS — Z113 Encounter for screening for infections with a predominantly sexual mode of transmission: Secondary | ICD-10-CM

## 2019-06-14 DIAGNOSIS — J45909 Unspecified asthma, uncomplicated: Secondary | ICD-10-CM | POA: Diagnosis not present

## 2019-06-14 DIAGNOSIS — A6004 Herpesviral vulvovaginitis: Secondary | ICD-10-CM | POA: Diagnosis not present

## 2019-06-14 DIAGNOSIS — R5383 Other fatigue: Secondary | ICD-10-CM | POA: Diagnosis not present

## 2019-06-14 DIAGNOSIS — Z1231 Encounter for screening mammogram for malignant neoplasm of breast: Secondary | ICD-10-CM

## 2019-06-14 DIAGNOSIS — J301 Allergic rhinitis due to pollen: Secondary | ICD-10-CM

## 2019-06-14 LAB — LIPID PANEL

## 2019-06-14 MED ORDER — VALACYCLOVIR HCL 1 G PO TABS: 1000.0000 mg | ORAL_TABLET | Freq: Two times a day (BID) | ORAL | 2 refills | Status: DC

## 2019-06-14 MED ORDER — ONDANSETRON 8 MG PO TBDP: 8.0000 mg | ORAL_TABLET | Freq: Three times a day (TID) | ORAL | 0 refills | Status: DC | PRN

## 2019-06-14 MED ORDER — CETIRIZINE HCL 10 MG PO TABS: 10.0000 mg | ORAL_TABLET | Freq: Every day | ORAL | 3 refills | Status: AC

## 2019-06-14 NOTE — Patient Instructions (Signed)
° ° ° °  If you have lab work done today you will be contacted with your lab results within the next 2 weeks.  If you have not heard from us then please contact us. The fastest way to get your results is to register for My Chart. ° ° °IF you received an x-ray today, you will receive an invoice from [facility]. Please contact [facility] at [phone] with questions or concerns regarding your invoice.  ° °IF you received labwork today, you will receive an invoice from [facility]. Please contact [facility] at [phone] with questions or concerns regarding your invoice.  ° °Our billing staff will not be able to assist you with questions regarding bills from these companies. ° °You will be contacted with the lab results as soon as they are available. The fastest way to get your results is to activate your My Chart account. Instructions are located on the last page of this paperwork. If you have not heard from us regarding the results in 2 weeks, please contact this office. °  ° ° ° °

## 2019-06-15 LAB — CBC
Hematocrit: 39.1 % (ref 34.0–46.6)
Hemoglobin: 13.2 g/dL (ref 11.1–15.9)
MCH: 30.1 pg (ref 26.6–33.0)
MCHC: 33.8 g/dL (ref 31.5–35.7)
MCV: 89 fL (ref 79–97)
Platelets: 257 10*3/uL (ref 150–450)
RBC: 4.39 x10E6/uL (ref 3.77–5.28)
RDW: 13 % (ref 11.7–15.4)
WBC: 4.4 10*3/uL (ref 3.4–10.8)

## 2019-06-15 LAB — CMP14+EGFR
ALT: 22 IU/L (ref 0–32)
AST: 26 IU/L (ref 0–40)
Albumin/Globulin Ratio: 1.2 (ref 1.2–2.2)
Albumin: 3.9 g/dL (ref 3.8–4.8)
Alkaline Phosphatase: 53 IU/L (ref 39–117)
BUN/Creatinine Ratio: 6 — ABNORMAL LOW (ref 9–23)
BUN: 6 mg/dL (ref 6–24)
Bilirubin Total: 0.4 mg/dL (ref 0.0–1.2)
CO2: 19 mmol/L — ABNORMAL LOW (ref 20–29)
Calcium: 9.4 mg/dL (ref 8.7–10.2)
Chloride: 103 mmol/L (ref 96–106)
Creatinine, Ser: 0.94 mg/dL (ref 0.57–1.00)
GFR calc Af Amer: 87 mL/min/{1.73_m2} (ref >59)
GFR calc non Af Amer: 75 mL/min/{1.73_m2} (ref >59)
Globulin, Total: 3.2 g/dL (ref 1.5–4.5)
Glucose: 94 mg/dL (ref 65–99)
Potassium: 4.4 mmol/L (ref 3.5–5.2)
Sodium: 138 mmol/L (ref 134–144)
Total Protein: 7.1 g/dL (ref 6.0–8.5)

## 2019-06-15 LAB — URINALYSIS, MICROSCOPIC ONLY
Bacteria, UA: NONE SEEN
Casts: NONE SEEN /lpf
Epithelial Cells (non renal): NONE SEEN /hpf (ref 0–10)
WBC, UA: NONE SEEN /hpf (ref 0–5)

## 2019-06-15 LAB — THYROID PANEL WITH TSH
Free Thyroxine Index: 2.5 (ref 1.2–4.9)
T3 Uptake Ratio: 28 % (ref 24–39)
T4, Total: 9.1 ug/dL (ref 4.5–12.0)
TSH: 1.15 u[IU]/mL (ref 0.450–4.500)

## 2019-06-15 LAB — LIPID PANEL
Chol/HDL Ratio: 3.2 ratio (ref 0.0–4.4)
Cholesterol, Total: 142 mg/dL (ref 100–199)
HDL: 44 mg/dL (ref >39)
LDL Chol Calc (NIH): 77 mg/dL (ref 0–99)
Triglycerides: 113 mg/dL (ref 0–149)
VLDL Cholesterol Cal: 21 mg/dL (ref 5–40)

## 2019-06-15 LAB — HSV(HERPES SIMPLEX VRS) I + II AB-IGG
HSV 1 Glycoprotein G Ab, IgG: <0.91 index (ref 0.00–0.90)
HSV 2 IgG, Type Spec: <0.91 index (ref 0.00–0.90)

## 2019-06-15 LAB — MICROALBUMIN, URINE: Microalbumin, Urine: <3 ug/mL

## 2019-06-20 ENCOUNTER — Encounter: Payer: Self-pay | Admitting: Adult Health Nurse Practitioner

## 2019-06-20 ENCOUNTER — Telehealth: Payer: Self-pay | Admitting: Adult Health Nurse Practitioner

## 2019-06-20 DIAGNOSIS — Z0000 Encounter for general adult medical examination without abnormal findings: Secondary | ICD-10-CM | POA: Insufficient documentation

## 2019-06-20 DIAGNOSIS — J45909 Unspecified asthma, uncomplicated: Secondary | ICD-10-CM | POA: Insufficient documentation

## 2019-06-20 DIAGNOSIS — R5383 Other fatigue: Secondary | ICD-10-CM | POA: Insufficient documentation

## 2019-06-20 DIAGNOSIS — Z113 Encounter for screening for infections with a predominantly sexual mode of transmission: Secondary | ICD-10-CM | POA: Insufficient documentation

## 2019-06-20 NOTE — Progress Notes (Signed)
Chief Complaint  Patient presents with  . Annual Exam    HPI   Patient is a new patient to myself.  Transfer of care.  Has lost insurance and recently got it again.  It she is following up on previous health problems.  She does have a history of an HSV-1 culture that came back positive.  This was a genital swab.  However, blood test came back negative for HSV-1 or HSV-2.  The only lesion she has had as when she had the first culture.  It was minimal.  She is unsure what to tell her sexual partners regarding an HSV diagnosis as it is not clear to her whether she has it or not.   Last week I had a phone visit for her due to nausea, vomiting, gastritis.  She feels she is better.  She is back at work.    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  Medications    Current Outpatient Medications:  .  albuterol (VENTOLIN HFA) 108 (90 Base) MCG/ACT inhaler, Inhale 2 puffs into the lungs every 4 (four) hours as needed for wheezing or shortness of breath (cough, shortness of breath or wheezing.)., Disp: 6.7 g, Rfl: 1 .  cetirizine (ZYRTEC) 10 MG tablet, Take 1 tablet (10 mg total) by mouth daily., Disp: 90 tablet, Rfl: 3 .  nystatin (MYCOSTATIN/NYSTOP) powder, Apply topically 4 (four) times daily., Disp: 30 g, Rfl: 0 .  ondansetron (ZOFRAN ODT) 8 MG disintegrating tablet, Take 1 tablet (8 mg total) by mouth every 8 (eight) hours as needed for nausea or vomiting., Disp: 10 tablet, Rfl: 0 .  valACYclovir (VALTREX) 1000 MG tablet, Take 1 tablet (1,000 mg total) by mouth 2 (two) times daily., Disp: 20 tablet, Rfl: 2   Review of Systems     Constitutional: Negative for activity change, appetite change, chills and fever.  HENT: Negative for congestion, nosebleeds, trouble swallowing and voice change.   Respiratory: Negative for cough, shortness of breath and wheezing.   Cardiac:  Negative for chest pain, pressure, syncope  Gastrointestinal: Negative for diarrhea, nausea and vomiting.  Genitourinary: Negative for difficulty urinating, dysuria, flank pain and hematuria.  Musculoskeletal: Negative for back pain, joint swelling and neck pain.  Neurological: Negative for dizziness, speech difficulty, light-headedness and numbness.  See HPI. All other review of systems negative.     Physical Exam:    height is '5\' 8"'  (1.727 m) and weight is 276 lb 6.4 oz (125.4 kg). Her temporal temperature is 97.6 F (36.4 C). Her blood pressure is 133/91 (abnormal) and her pulse is 87. Her oxygen saturation is 98%.   Physical Examination: General appearance - alert, well appearing, and in no distress and oriented to person, place, and time Mental status - normal mood, behavior, speech, dress, motor activity, and thought processes Eyes - PERRL. Extraocular movements intact.  No nystagmus.  Neck - supple, no significant adenopathy, carotids upstroke normal bilaterally, no bruits, thyroid exam: thyroid is normal in size without nodules or tenderness Chest - clear to auscultation, no wheezes, rales or rhonchi, symmetric air entry  Heart - normal rate, regular rhythm, normal S1, S2, no murmurs, rubs, clicks or gallops Extremities - dependent  LE edema without clubbing or cyanosis Skin - normal coloration and turgor, no rashes, no suspicious skin lesions noted  No hyperpigmentation of skin.  No current hematomas noted   Lab /Imaging Review    No labs reviewed today.  Assessment & Plan:  Sydney Beck is a 44 y.o. female    1. Nausea and vomiting, intractability of vomiting not specified, unspecified vomiting type   2. Herpes simplex  vulvovaginitis   3. Asthma due to seasonal allergies   4. Annual physical exam   5. Fatigue, unspecified type   6. Allergic rhinitis due to pollen, unspecified seasonality   7. Routine screening for STI (sexually transmitted infection)    Orders Placed This Encounter  Procedures  . CBC  . CMP14+EGFR  . Lipid panel  . HSV(herpes simplex vrs) 1+2 ab-IgG  . Thyroid Panel With TSH  . Microalbumin, urine  . Urine Microscopic  . POCT urinalysis dipstick   Meds ordered this encounter  Medications  . valACYclovir (VALTREX) 1000 MG tablet    Sig: Take 1 tablet (1,000 mg total) by mouth 2 (two) times daily.    Dispense:  20 tablet    Refill:  2  . cetirizine (ZYRTEC) 10 MG tablet    Sig: Take 1 tablet (10 mg total) by mouth daily.    Dispense:  90 tablet    Refill:  3  . DISCONTD: ondansetron (ZOFRAN ODT) 8 MG disintegrating tablet    Sig: Take 1 tablet (8 mg total) by mouth every 8 (eight) hours as needed for nausea or vomiting.    Dispense:  10 tablet    Refill:  0  . ondansetron (ZOFRAN ODT) 8 MG disintegrating tablet    Sig: Take 1 tablet (8 mg total) by mouth every 8 (eight) hours as needed for nausea or vomiting.    Dispense:  10 tablet    Refill:  0   Will f/u in person regarding HSV results.  Patient is inline with this plan   Glyn Ade, NP

## 2019-06-21 ENCOUNTER — Ambulatory Visit: Payer: BC Managed Care – PPO | Admitting: Adult Health Nurse Practitioner

## 2019-06-21 ENCOUNTER — Other Ambulatory Visit: Payer: Self-pay

## 2019-06-21 ENCOUNTER — Ambulatory Visit (INDEPENDENT_AMBULATORY_CARE_PROVIDER_SITE_OTHER): Payer: BC Managed Care – PPO | Admitting: Family Medicine

## 2019-06-21 ENCOUNTER — Encounter: Payer: Self-pay | Admitting: Family Medicine

## 2019-06-21 VITALS — BP 118/82 | HR 84 | Temp 98.0°F | Resp 15 | Ht 68.0 in | Wt 281.0 lb

## 2019-06-21 DIAGNOSIS — Z833 Family history of diabetes mellitus: Secondary | ICD-10-CM | POA: Diagnosis not present

## 2019-06-21 DIAGNOSIS — Z8719 Personal history of other diseases of the digestive system: Secondary | ICD-10-CM

## 2019-06-21 DIAGNOSIS — Z8619 Personal history of other infectious and parasitic diseases: Secondary | ICD-10-CM

## 2019-06-21 NOTE — Progress Notes (Signed)
Patient ID: Sydney Beck, female    DOB: 1975/11/12  Age: 44 y.o. MRN: [id number]  Chief Complaint  Patient presents with  . Follow-up    pt is doing better has a history of reflux but is doing much better, needs to review labs especially the HSV-1 culture/blood test    Subjective:   44 year old lady who is here for follow-up from some test she had done.  She had been having a lot of trouble with GERD and though symptoms seem to have resolved and she has no further questions regarding that.  Her laboratory tests showed negative herpes simplex 1 and 2.  We had a long discussion regarding this because she had a previously positive culture on the lesion a couple of years ago showing that she did have HSV 2.  She wants to know what she needs to tell regarding her risks.  Current allergies, medications, problem list, past/family and social histories reviewed.  Objective:  BP 118/82   Pulse 84   Temp 98 F (36.7 C) (Temporal)   Resp 15   Ht 5\' 8"  (1.727 m)   Wt 281 lb (127.5 kg)   LMP 06/04/2019   SpO2 100%   BMI 42.73 kg/m   Went through labs and answered questions.  Assessment & Plan:   Assessment: 1. History of gastroesophageal reflux (GERD)   2. Family history of diabetes mellitus   3. History of herpes labialis       Plan: See instructions below.  I have to believe that she does not actually have herpes  the and that her body needed off in the past.  No orders of the defined types were placed in this encounter.   No orders of the defined types were placed in this encounter.        Patient Instructions    Your blood test for herpes is negative at this time.  You certainly had the positive culture a few years ago, which is a definitive test.  There are some people who seem to get rid of herpes from the system and do not persist in being infected like most people do.  I would have to consider you to not have the disease at this time, but if you ever develop any  suspicious symptoms please get checked immediately.  Also, I would recommend every several years getting tested.  It is difficult to explain this fully, but I think your immune system did what it is supposed today.  Continue to always practice safe sex if you choose to be sexually involved.  Although your blood sugar is good at this time, you are at risk for diabetes with your family history and your weight and race.  Continue to try and minimize carbohydrate intake and maximize exercise.  As per our discussion, I would urge you to strongly reconsider the question of getting the COVID-19 vaccine.  There is no question in my mind that benefits far out see the risk.  Return as needed   If you have lab work done today you will be contacted with your lab results within the next 2 weeks.  If you have not heard from [place] then please contact us. The fastest way to get your results is to register for My Chart.   IF you received an x-ray today, you will receive an invoice from [facility]. Please contact [facility] at [phone] with questions or concerns regarding your invoice.   IF you received labwork today, you will receive  an Pharmacologist from [facility]. Please contact [facility] at [phone] with questions or concerns regarding your invoice.   Our billing staff will not be able to assist you with questions regarding bills from these companies.  You will be contacted with the lab results as soon as they are available. The fastest way to get your results is to activate your My Chart account. Instructions are located on the last page of this paperwork. If you have not heard from [place] regarding the results in 2 weeks, please contact this office.        Return if symptoms worsen or fail to improve.   Ruben Reason, MD 06/21/2019

## 2019-06-21 NOTE — Patient Instructions (Addendum)
  Your blood test for herpes is negative at this time.  You certainly had the positive culture a few years ago, which is a definitive test.  There are some people who seem to get rid of herpes from the system and do not persist in being infected like most people do.  I would have to consider you to not have the disease at this time, but if you ever develop any suspicious symptoms please get checked immediately.  Also, I would recommend every several years getting tested.  It is difficult to explain this fully, but I think your immune system did what it is supposed today.  Continue to always practice safe sex if you choose to be sexually involved.  Although your blood sugar is good at this time, you are at risk for diabetes with your family history and your weight and race.  Continue to try and minimize carbohydrate intake and maximize exercise.  As per our discussion, I would urge you to strongly reconsider the question of getting the COVID-19 vaccine.  There is no question in my mind that benefits far out see the risk.  Return as needed   If you have lab work done today you will be contacted with your lab results within the next 2 weeks.  If you have not heard from [place] then please contact us. The fastest way to get your results is to register for My Chart.   IF you received an x-ray today, you will receive an invoice from [facility]. Please contact [facility] at [phone] with questions or concerns regarding your invoice.   IF you received labwork today, you will receive an invoice from [facility]. Please contact [facility] at [phone] with questions or concerns regarding your invoice.   Our billing staff will not be able to assist you with questions regarding bills from these companies.  You will be contacted with the lab results as soon as they are available. The fastest way to get your results is to activate your My Chart account. Instructions are located on the last  page of this paperwork. If you have not heard from [place] regarding the results in 2 weeks, please contact this office.

## 2019-09-02 ENCOUNTER — Ambulatory Visit (INDEPENDENT_AMBULATORY_CARE_PROVIDER_SITE_OTHER): Payer: BC Managed Care – PPO | Admitting: Registered Nurse

## 2019-09-02 ENCOUNTER — Encounter: Payer: Self-pay | Admitting: Registered Nurse

## 2019-09-02 ENCOUNTER — Other Ambulatory Visit: Payer: Self-pay

## 2019-09-02 VITALS — BP 115/81 | HR 92 | Temp 97.3°F | Resp 18 | Ht 68.0 in | Wt 277.0 lb

## 2019-09-02 DIAGNOSIS — L0231 Cutaneous abscess of buttock: Secondary | ICD-10-CM

## 2019-09-02 MED ORDER — DOXYCYCLINE HYCLATE 100 MG PO TABS: 100.0000 mg | ORAL_TABLET | Freq: Two times a day (BID) | ORAL | 0 refills | Status: DC

## 2019-09-02 NOTE — Patient Instructions (Signed)
° ° ° °  If you have lab work done today you will be contacted with your lab results within the next 2 weeks.  If you have not heard from us then please contact us. The fastest way to get your results is to register for My Chart. ° ° °IF you received an x-ray today, you will receive an invoice from [facility]. Please contact [facility] at [phone] with questions or concerns regarding your invoice.  ° °IF you received labwork today, you will receive an invoice from [facility]. Please contact [facility] at [phone] with questions or concerns regarding your invoice.  ° °Our billing staff will not be able to assist you with questions regarding bills from these companies. ° °You will be contacted with the lab results as soon as they are available. The fastest way to get your results is to activate your My Chart account. Instructions are located on the last page of this paperwork. If you have not heard from us regarding the results in 2 weeks, please contact this office. °  ° ° ° °

## 2019-09-02 NOTE — Progress Notes (Signed)
Acute Office Visit  Subjective:    Patient ID: Sydney Beck, female    DOB: 03-26-1975, 44 y.o.   MRN: [id number]  Chief Complaint  Patient presents with   Recurrent Skin Infections    patient states she has had a Boil on her bottom for a few weeks. Patient states that sometimes they get a little painful.    HPI Patient is in today for boil on buttock Has been there for a number of weeks but getting more painful and uncomfortable Appears to have come to a head No drainage No spreading redness or systemic signs of infection  Has been told in past possible HS  Has not seen derm for this.   Past Medical History:  Diagnosis Date   Anxiety    Depression     Past Surgical History:  Procedure Laterality Date   BREAST CYST ASPIRATION Right    age 70 cyst aspirated   CYST REMOVAL NECK     KNEE SURGERY Right     Family History  Problem Relation Age of Onset   Diabetes Father    Depression Father    Anxiety disorder Father    Dementia Father    Anxiety disorder Mother    Depression Mother    Schizophrenia Brother    Alcohol abuse Brother    Drug abuse Brother    Depression Brother    Anxiety disorder Brother     Social History   Socioeconomic History   Marital status: Single    Spouse name: Not on file   Number of children: 0   Years of education: Not on file   Highest education level: Some college, no degree  Occupational History   Not on file  Tobacco Use   Smoking status: Light Tobacco Smoker    Types: Cigarettes   Smokeless tobacco: Never Used   Tobacco comment: socical   Vaping Use   Vaping Use: Never used  Substance and Sexual Activity   Alcohol use: Yes    Alcohol/week: 20.0 standard drinks    Types: 15 Glasses of wine, 5 Shots of liquor per week   Drug use: Yes    Types: Marijuana    Comment: used on a weekly basis   Sexual activity: Yes  Other Topics Concern   Not on file  Social History Narrative   Not  on file   Social Determinants of Health   Financial Resource Strain:    Difficulty of Paying Living Expenses:   Food Insecurity:    Worried About Charity fundraiser in the Last Year:    Arboriculturist in the Last Year:   Transportation Needs:    Film/video editor (Medical):    Lack of Transportation (Non-Medical):   Physical Activity:    Days of Exercise per Week:    Minutes of Exercise per Session:   Stress:    Feeling of Stress :   Social Connections:    Frequency of Communication with Friends and Family:    Frequency of Social Gatherings with Friends and Family:    Attends Religious Services:    Active Member of Clubs or Organizations:    Attends Archivist Meetings:    Marital Status:   Intimate Partner Violence:    Fear of Current or Ex-Partner:    Emotionally Abused:    Physically Abused:    Sexually Abused:     Outpatient Medications Prior to Visit  Medication Sig Dispense Refill   albuterol (  VENTOLIN HFA) 108 (90 Base) MCG/ACT inhaler Inhale 2 puffs into the lungs every 4 (four) hours as needed for wheezing or shortness of breath (cough, shortness of breath or wheezing.). 6.7 g 1   cetirizine (ZYRTEC) 10 MG tablet Take 1 tablet (10 mg total) by mouth daily. 90 tablet 3   nystatin (MYCOSTATIN/NYSTOP) powder Apply topically 4 (four) times daily. 30 g 0   ondansetron (ZOFRAN ODT) 8 MG disintegrating tablet Take 1 tablet (8 mg total) by mouth every 8 (eight) hours as needed for nausea or vomiting. 10 tablet 0   valACYclovir (VALTREX) 1000 MG tablet Take 1 tablet (1,000 mg total) by mouth 2 (two) times daily. (Patient not taking: Reported on 09/02/2019) 20 tablet 2   No facility-administered medications prior to visit.    Allergies  Allergen Reactions   Bee Venom Swelling    Other reaction(s): itching /swelling   Oxycodone Hcl Itching    Other reaction(s): itching   Sulfa Antibiotics     Other reaction(s): itching Itching,  hives    Review of Systems Per hpi      Objective:    Physical Exam Vitals and nursing note reviewed.  Constitutional:      General: She is not in acute distress.    Appearance: Normal appearance. She is obese. She is not ill-appearing, toxic-appearing or diaphoretic.  Cardiovascular:     Rate and Rhythm: Normal rate and regular rhythm.  Pulmonary:     Effort: Pulmonary effort is normal. No respiratory distress.  Skin:    General: Skin is warm and dry.     Findings: Lesion present.       Neurological:     General: No focal deficit present.     Mental Status: She is alert and oriented to person, place, and time. Mental status is at baseline.  Psychiatric:        Mood and Affect: Mood normal.        Behavior: Behavior normal.        Thought Content: Thought content normal.        Judgment: Judgment normal.     BP 115/81    Pulse 92    Temp (!) 97.3 F (36.3 C) (Temporal)    Resp 18    Ht 5\' 8"  (1.727 m)    Wt 277 lb (125.6 kg)    SpO2 97%    BMI 42.12 kg/m  Wt Readings from Last 3 Encounters:  09/02/19 277 lb (125.6 kg)  06/21/19 281 lb (127.5 kg)  06/14/19 276 lb 6.4 oz (125.4 kg)    There are no preventive care reminders to display for this patient.  There are no preventive care reminders to display for this patient.   Lab Results  Component Value Date   TSH 1.150 06/14/2019   Lab Results  Component Value Date   WBC 4.4 06/14/2019   HGB 13.2 06/14/2019   HCT 39.1 06/14/2019   MCV 89 06/14/2019   PLT 257 06/14/2019   Lab Results  Component Value Date   NA 138 06/14/2019   K 4.4 06/14/2019   CO2 19 (L) 06/14/2019   GLUCOSE 94 06/14/2019   BUN 6 06/14/2019   CREATININE 0.94 06/14/2019   BILITOT 0.4 06/14/2019   ALKPHOS 53 06/14/2019   AST 26 06/14/2019   ALT 22 06/14/2019   PROT 7.1 06/14/2019   ALBUMIN 3.9 06/14/2019   CALCIUM 9.4 06/14/2019   ANIONGAP 8 05/23/2017   Lab Results  Component Value Date  CHOL 142 06/14/2019   Lab Results    Component Value Date   HDL 44 06/14/2019   Lab Results  Component Value Date   LDLCALC 77 06/14/2019   Lab Results  Component Value Date   TRIG 113 06/14/2019   Lab Results  Component Value Date   CHOLHDL 3.2 06/14/2019   Lab Results  Component Value Date   HGBA1C 5.6 03/09/2016       Assessment & Plan:   Problem List Items Addressed This Visit    None    Visit Diagnoses    Abscess of multiple sites of buttock    -  Primary   Relevant Medications   doxycycline (VIBRA-TABS) 100 MG tablet       Meds ordered this encounter  Medications   doxycycline (VIBRA-TABS) 100 MG tablet    Sig: Take 1 tablet (100 mg total) by mouth 2 (two) times daily.    Dispense:  20 tablet    Refill:  0    Order Specific Question:   Supervising Provider    Answer:   Carlota Raspberry, JEFFREY R [[id number]]   PLAN  Well circumscribed cyst.   Numbed with 2% lidocaine 1cc. #11 scalpel used to briefly lance lesion with >1cm incision. Pressure produced a significant amount of purulent drainage. Placed steristrip and gauze cover. Instructed patient to keep clean and dry  Doxycycline 100mg  PO bid for 10 days  Wound culture sent  Do not think this was HS lesion but worth keeping an eye on  Patient encouraged to call clinic with any questions, comments, or concerns.  Maximiano Coss, NP

## 2019-10-03 ENCOUNTER — Telehealth: Payer: Self-pay | Admitting: Registered Nurse

## 2019-10-03 NOTE — Telephone Encounter (Signed)
Pt was prescribed doxycycline (VIBRA-TABS) 100 MG tablet  For her boils but this medication tore her stomach up / Pt found that a topical worked better for her stomach and this is clotrimazole betamethasone 1% with 0.5 %base cream/ Pt wanted to know if she can get this prescribed/ please advise

## 2019-10-07 NOTE — Telephone Encounter (Signed)
Patient returning call about this request.

## 2019-10-11 ENCOUNTER — Other Ambulatory Visit: Payer: Self-pay | Admitting: Registered Nurse

## 2019-10-11 DIAGNOSIS — L0231 Cutaneous abscess of buttock: Secondary | ICD-10-CM

## 2019-10-11 MED ORDER — CLOTRIMAZOLE-BETAMETHASONE 1-0.05 % EX CREA: 1.0000 "application " | TOPICAL_CREAM | Freq: Two times a day (BID) | CUTANEOUS | 2 refills | Status: AC

## 2019-10-11 NOTE — Telephone Encounter (Signed)
Apologies for the delay - I had been out of the office. Topical has been sent  Thanks,  Kathrin Ruddy, NP

## 2019-10-15 ENCOUNTER — Ambulatory Visit (INDEPENDENT_AMBULATORY_CARE_PROVIDER_SITE_OTHER): Payer: BC Managed Care – PPO | Admitting: Registered Nurse

## 2019-10-15 ENCOUNTER — Encounter: Payer: Self-pay | Admitting: Registered Nurse

## 2019-10-15 ENCOUNTER — Other Ambulatory Visit: Payer: Self-pay

## 2019-10-15 VITALS — BP 112/90 | HR 73 | Temp 97.7°F | Ht 68.0 in | Wt 277.2 lb

## 2019-10-15 DIAGNOSIS — L02419 Cutaneous abscess of limb, unspecified: Secondary | ICD-10-CM

## 2019-10-15 DIAGNOSIS — L03119 Cellulitis of unspecified part of limb: Secondary | ICD-10-CM

## 2019-10-15 DIAGNOSIS — Z20822 Contact with and (suspected) exposure to covid-19: Secondary | ICD-10-CM

## 2019-10-15 MED ORDER — CLINDAMYCIN HCL 300 MG PO CAPS: 300.0000 mg | ORAL_CAPSULE | Freq: Three times a day (TID) | ORAL | 0 refills | Status: DC

## 2019-10-15 MED ORDER — CIPROFLOXACIN HCL 500 MG PO TABS: 500.0000 mg | ORAL_TABLET | Freq: Two times a day (BID) | ORAL | 0 refills | Status: DC

## 2019-10-15 NOTE — Patient Instructions (Signed)
° ° ° °  If you have lab work done today you will be contacted with your lab results within the next 2 weeks.  If you have not heard from us then please contact us. The fastest way to get your results is to register for My Chart. ° ° °IF you received an x-ray today, you will receive an invoice from [facility]. Please contact [facility] at [phone] with questions or concerns regarding your invoice.  ° °IF you received labwork today, you will receive an invoice from [facility]. Please contact [facility] at [phone] with questions or concerns regarding your invoice.  ° °Our billing staff will not be able to assist you with questions regarding bills from these companies. ° °You will be contacted with the lab results as soon as they are available. The fastest way to get your results is to activate your My Chart account. Instructions are located on the last page of this paperwork. If you have not heard from us regarding the results in 2 weeks, please contact this office. °  ° ° ° °

## 2019-10-15 NOTE — Addendum Note (Signed)
Addended by: Kittie Plater, Berry Gallacher HUA on: 10/15/2019 10:54 AM   Modules accepted: Orders

## 2019-10-15 NOTE — Progress Notes (Signed)
Acute Office Visit  Subjective:    Patient ID: Sydney Beck, female    DOB: Dec 22, 1975, 44 y.o.   MRN: [id number]  Chief Complaint  Patient presents with   left leg mass.    used to be boil that went way by. but now it is back. going on a week    HPI Patient is in today for boil on leg  Upper inner left thigh. Started a little less than a week ago. Worsened but now feels stable. No drainage. It is tender to the touch. Area of tenderness around it seems to be spreading. No nvd, fevers, chills, fatigue.  Last seen on 10/11/19 for perirectal abscess for which we gave doxycycline. Unfortunately she was experiencing too many GI side effects to finish this course. Will provider alternative treatment today.   No other concerns or complaints at this time.   Past Medical History:  Diagnosis Date   Anxiety    Depression     Past Surgical History:  Procedure Laterality Date   BREAST CYST ASPIRATION Right    age 89 cyst aspirated   CYST REMOVAL NECK     KNEE SURGERY Right     Family History  Problem Relation Age of Onset   Diabetes Father    Depression Father    Anxiety disorder Father    Dementia Father    Anxiety disorder Mother    Depression Mother    Schizophrenia Brother    Alcohol abuse Brother    Drug abuse Brother    Depression Brother    Anxiety disorder Brother     Social History   Socioeconomic History   Marital status: Single    Spouse name: Not on file   Number of children: 0   Years of education: Not on file   Highest education level: Some college, no degree  Occupational History   Not on file  Tobacco Use   Smoking status: Light Tobacco Smoker    Types: Cigarettes   Smokeless tobacco: Never Used   Tobacco comment: socical   Vaping Use   Vaping Use: Never used  Substance and Sexual Activity   Alcohol use: Yes    Alcohol/week: 20.0 standard drinks    Types: 15 Glasses of wine, 5 Shots of liquor per week   Drug  use: Yes    Types: Marijuana    Comment: used on a weekly basis   Sexual activity: Yes  Other Topics Concern   Not on file  Social History Narrative   Not on file   Social Determinants of Health   Financial Resource Strain:    Difficulty of Paying Living Expenses: Not on file  Food Insecurity:    Worried About Charity fundraiser in the Last Year: Not on file   [employer] of Food in the Last Year: Not on file  Transportation Needs:    Lack of Transportation (Medical): Not on file   Lack of Transportation (Non-Medical): Not on file  Physical Activity:    Days of Exercise per Week: Not on file   Minutes of Exercise per Session: Not on file  Stress:    Feeling of Stress : Not on file  Social Connections:    Frequency of Communication with Friends and Family: Not on file   Frequency of Social Gatherings with Friends and Family: Not on file   Attends Religious Services: Not on file   Active Member of Clubs or Organizations: Not on file   Attends Club  or Organization Meetings: Not on file   Marital Status: Not on file  Intimate Partner Violence:    Fear of Current or Ex-Partner: Not on file   Emotionally Abused: Not on file   Physically Abused: Not on file   Sexually Abused: Not on file    Outpatient Medications Prior to Visit  Medication Sig Dispense Refill   albuterol (VENTOLIN HFA) 108 (90 Base) MCG/ACT inhaler Inhale 2 puffs into the lungs every 4 (four) hours as needed for wheezing or shortness of breath (cough, shortness of breath or wheezing.). 6.7 g 1   cetirizine (ZYRTEC) 10 MG tablet Take 1 tablet (10 mg total) by mouth daily. 90 tablet 3   clotrimazole-betamethasone (LOTRISONE) cream Apply 1 application topically 2 (two) times daily. 30 g 2   nystatin (MYCOSTATIN/NYSTOP) powder Apply topically 4 (four) times daily. 30 g 0   valACYclovir (VALTREX) 1000 MG tablet Take 1 tablet (1,000 mg total) by mouth 2 (two) times daily. (Patient not taking:  Reported on 09/02/2019) 20 tablet 2   doxycycline (VIBRA-TABS) 100 MG tablet Take 1 tablet (100 mg total) by mouth 2 (two) times daily. 20 tablet 0   ondansetron (ZOFRAN ODT) 8 MG disintegrating tablet Take 1 tablet (8 mg total) by mouth every 8 (eight) hours as needed for nausea or vomiting. 10 tablet 0   No facility-administered medications prior to visit.    Allergies  Allergen Reactions   Bee Venom Swelling    Other reaction(s): itching /swelling   Oxycodone Hcl Itching    Other reaction(s): itching   Sulfa Antibiotics     Other reaction(s): itching Itching, hives    Review of Systems Pertinent positives and negatives per hpi     Objective:    Physical Exam Vitals and nursing note reviewed.  Constitutional:      General: She is not in acute distress.    Appearance: Normal appearance. She is obese. She is not ill-appearing, toxic-appearing or diaphoretic.  Cardiovascular:     Rate and Rhythm: Normal rate and regular rhythm.  Pulmonary:     Effort: Pulmonary effort is normal. No respiratory distress.  Skin:    General: Skin is warm and dry.     Coloration: Skin is not jaundiced or pale.     Findings: Lesion (boil on upper inner left thigh. TTP. no drainage. no erythema. ) present. No bruising, erythema or rash.  Neurological:     General: No focal deficit present.     Mental Status: She is alert and oriented to person, place, and time. Mental status is at baseline.  Psychiatric:        Mood and Affect: Mood normal.        Behavior: Behavior normal.        Thought Content: Thought content normal.        Judgment: Judgment normal.     BP 112/90    Pulse 73    Temp 97.7 F (36.5 C) (Temporal)    Ht 5\' 8"  (1.727 m)    Wt 277 lb 3.2 oz (125.7 kg)    LMP 10/11/2019    SpO2 98%    BMI 42.15 kg/m  Wt Readings from Last 3 Encounters:  10/15/19 277 lb 3.2 oz (125.7 kg)  09/02/19 277 lb (125.6 kg)  06/21/19 281 lb (127.5 kg)    Health Maintenance Due  Topic Date  Due   COVID-19 Vaccine (1) Never done    There are no preventive care reminders to display for  this patient.   Lab Results  Component Value Date   TSH 1.150 06/14/2019   Lab Results  Component Value Date   WBC 4.4 06/14/2019   HGB 13.2 06/14/2019   HCT 39.1 06/14/2019   MCV 89 06/14/2019   PLT 257 06/14/2019   Lab Results  Component Value Date   NA 138 06/14/2019   K 4.4 06/14/2019   CO2 19 (L) 06/14/2019   GLUCOSE 94 06/14/2019   BUN 6 06/14/2019   CREATININE 0.94 06/14/2019   BILITOT 0.4 06/14/2019   ALKPHOS 53 06/14/2019   AST 26 06/14/2019   ALT 22 06/14/2019   PROT 7.1 06/14/2019   ALBUMIN 3.9 06/14/2019   CALCIUM 9.4 06/14/2019   ANIONGAP 8 05/23/2017   Lab Results  Component Value Date   CHOL 142 06/14/2019   Lab Results  Component Value Date   HDL 44 06/14/2019   Lab Results  Component Value Date   LDLCALC 77 06/14/2019   Lab Results  Component Value Date   TRIG 113 06/14/2019   Lab Results  Component Value Date   CHOLHDL 3.2 06/14/2019   Lab Results  Component Value Date   HGBA1C 5.6 03/09/2016       Assessment & Plan:   Problem List Items Addressed This Visit    None    Visit Diagnoses    Cellulitis and abscess of leg    -  Primary   Relevant Medications   clindamycin (CLEOCIN) 300 MG capsule   ciprofloxacin (CIPRO) 500 MG tablet       Meds ordered this encounter  Medications   clindamycin (CLEOCIN) 300 MG capsule    Sig: Take 1 capsule (300 mg total) by mouth 3 (three) times daily.    Dispense:  30 capsule    Refill:  0    Order Specific Question:   Supervising Provider    Answer:   Carlota Raspberry, JEFFREY R [[id number]]   ciprofloxacin (CIPRO) 500 MG tablet    Sig: Take 1 tablet (500 mg total) by mouth 2 (two) times daily.    Dispense:  20 tablet    Refill:  0    Order Specific Question:   Supervising Provider    Answer:   Carlota Raspberry, JEFFREY R [[id number]]   PLAN  Incision and drainage: Informed consent obtained following  explanation of procedure to patient and given opportunity to ask questions and address concerns. Lidocaine 1% used to numb location and #11 scalpel used to lance boil. Mild to moderate amount of purulent drainage produced from lesion with pressure. Collected for culture, will send out. Placed steri strips and sterile gauze over small incision. Reviewed wound care with patient  cipro 500mg  PO bid for 10 days. Clindamycin 300mg  PO tid for 10 days  Return precautions reviewed  Patient encouraged to call clinic with any questions, comments, or concerns.   Maximiano Coss, NP

## 2019-10-16 LAB — SAR COV2 SEROLOGY (COVID19)AB(IGG),IA: DiaSorin SARS-CoV-2 Ab, IgG: POSITIVE

## 2019-10-18 LAB — WOUND CULTURE: Organism ID, Bacteria: NONE SEEN

## 2019-10-20 ENCOUNTER — Encounter: Payer: Self-pay | Admitting: Registered Nurse

## 2019-10-22 ENCOUNTER — Encounter: Payer: Self-pay | Admitting: Registered Nurse

## 2019-10-22 NOTE — Telephone Encounter (Signed)
Some drainage mostly itchy

## 2019-10-22 NOTE — Telephone Encounter (Signed)
Asking you to advise on wound site as it is swollen and itchy

## 2019-11-05 ENCOUNTER — Encounter: Payer: Self-pay | Admitting: Registered Nurse

## 2019-11-05 ENCOUNTER — Ambulatory Visit (INDEPENDENT_AMBULATORY_CARE_PROVIDER_SITE_OTHER): Payer: BC Managed Care – PPO | Admitting: Registered Nurse

## 2019-11-05 ENCOUNTER — Other Ambulatory Visit: Payer: Self-pay

## 2019-11-05 VITALS — BP 117/87 | HR 67 | Temp 98.1°F | Resp 18 | Ht 68.0 in

## 2019-11-05 DIAGNOSIS — A6004 Herpesviral vulvovaginitis: Secondary | ICD-10-CM

## 2019-11-05 DIAGNOSIS — L732 Hidradenitis suppurativa: Secondary | ICD-10-CM | POA: Diagnosis not present

## 2019-11-05 DIAGNOSIS — Z7689 Persons encountering health services in other specified circumstances: Secondary | ICD-10-CM

## 2019-11-05 DIAGNOSIS — B379 Candidiasis, unspecified: Secondary | ICD-10-CM

## 2019-11-05 MED ORDER — FLUCONAZOLE 150 MG PO TABS: 150.0000 mg | ORAL_TABLET | Freq: Once | ORAL | 0 refills | Status: AC

## 2019-11-05 MED ORDER — VALACYCLOVIR HCL 1 G PO TABS: 1000.0000 mg | ORAL_TABLET | Freq: Two times a day (BID) | ORAL | 2 refills | Status: DC

## 2019-11-05 MED ORDER — PREDNISONE 10 MG (21) PO TBPK: ORAL_TABLET | ORAL | 0 refills | Status: DC

## 2019-11-05 NOTE — Progress Notes (Signed)
Established Patient Office Visit  Subjective:  Patient ID: Sydney Beck, female    DOB: [birth year]  Age: 44 y.o. MRN: [id number]  CC:  Chief Complaint  Patient presents with  . Transitions Of Care    Patient states she is here for TOC. Patient states she keeps getting a cyrt on her bottom on the left cheek  . Medication Refill    Patient states need medication refill for Valtrex    HPI Sydney Beck presents for TOC from [place] to myself as PCP  Notes that cysts have continued - does note today that she has a history of suspected hidradenitis suppurativa. Interested in referral to derm for further work up but notes her insurance ends at the end of the month as she has quit her job. Has a new job lined up, unsure of when benefits kick in, will call to update at that time  Otherwise feeling well. Needs refill on valtrex. Concerned for candidal infection after recent abx course - has happened commonly in past.   No other concerns.   Past Medical History:  Diagnosis Date  . Anxiety   . Depression     Past Surgical History:  Procedure Laterality Date  . BREAST CYST ASPIRATION Right    age 51 cyst aspirated  . CYST REMOVAL NECK    . INGUINAL HIDRADENITIS EXCISION    . KNEE SURGERY Right     Family History  Problem Relation Age of Onset  . Diabetes Father   . Depression Father   . Anxiety disorder Father   . Dementia Father   . Anxiety disorder Mother   . Depression Mother   . Schizophrenia Brother   . Alcohol abuse Brother   . Drug abuse Brother   . Depression Brother   . Anxiety disorder Brother     Social History   Socioeconomic History  . Marital status: Single    Spouse name: Not on file  . Number of children: 0  . Years of education: Not on file  . Highest education level: Some college, no degree  Occupational History  . Not on file  Tobacco Use  . Smoking status: Light Tobacco Smoker    Types: Cigarettes  . Smokeless tobacco: Never Used  .  Tobacco comment: socical - black and milds  Vaping Use  . Vaping Use: Never used  Substance and Sexual Activity  . Alcohol use: Yes    Alcohol/week: 20.0 standard drinks    Types: 15 Glasses of wine, 5 Shots of liquor per week  . Drug use: Yes    Types: Marijuana    Comment: used on a weekly basis  . Sexual activity: Yes  Other Topics Concern  . Not on file  Social History Narrative  . Not on file   Social Determinants of Health   Financial Resource Strain:   . Difficulty of Paying Living Expenses: Not on file  Food Insecurity:   . Worried About Charity fundraiser in the Last Year: Not on file  . Ran Out of Food in the Last Year: Not on file  Transportation Needs:   . Lack of Transportation (Medical): Not on file  . Lack of Transportation (Non-Medical): Not on file  Physical Activity:   . Days of Exercise per Week: Not on file  . Minutes of Exercise per Session: Not on file  Stress:   . Feeling of Stress : Not on file  Social Connections:   . Frequency of Communication with  Friends and Family: Not on file  . Frequency of Social Gatherings with Friends and Family: Not on file  . Attends Religious Services: Not on file  . Active Member of Clubs or Organizations: Not on file  . Attends Archivist Meetings: Not on file  . Marital Status: Not on file  Intimate Partner Violence:   . Fear of Current or Ex-Partner: Not on file  . Emotionally Abused: Not on file  . Physically Abused: Not on file  . Sexually Abused: Not on file    Outpatient Medications Prior to Visit  Medication Sig Dispense Refill  . albuterol (VENTOLIN HFA) 108 (90 Base) MCG/ACT inhaler Inhale 2 puffs into the lungs every 4 (four) hours as needed for wheezing or shortness of breath (cough, shortness of breath or wheezing.). 6.7 g 1  . cetirizine (ZYRTEC) 10 MG tablet Take 1 tablet (10 mg total) by mouth daily. 90 tablet 3  . clotrimazole-betamethasone (LOTRISONE) cream Apply 1 application  topically 2 (two) times daily. 30 g 2  . nystatin (MYCOSTATIN/NYSTOP) powder Apply topically 4 (four) times daily. 30 g 0  . valACYclovir (VALTREX) 1000 MG tablet Take 1 tablet (1,000 mg total) by mouth 2 (two) times daily. 20 tablet 2  . ciprofloxacin (CIPRO) 500 MG tablet Take 1 tablet (500 mg total) by mouth 2 (two) times daily. (Patient not taking: Reported on 11/05/2019) 20 tablet 0  . clindamycin (CLEOCIN) 300 MG capsule Take 1 capsule (300 mg total) by mouth 3 (three) times daily. (Patient not taking: Reported on 11/05/2019) 30 capsule 0   No facility-administered medications prior to visit.    Allergies  Allergen Reactions  . Other Hives and Swelling  . Bee Venom Swelling    Other reaction(s): itching /swelling  . Oxycodone Hcl Itching    Other reaction(s): itching  . Sulfa Antibiotics     Other reaction(s): itching Itching, hives    ROS Review of Systems  Constitutional: Negative.   HENT: Negative.   Eyes: Negative.   Respiratory: Negative.   Cardiovascular: Negative.   Gastrointestinal: Negative.   Endocrine: Negative.   Genitourinary: Positive for vaginal discharge. Negative for decreased urine volume, difficulty urinating, dyspareunia, dysuria, enuresis, flank pain, frequency, genital sores, hematuria, menstrual problem, pelvic pain, urgency, vaginal bleeding and vaginal pain.  Musculoskeletal: Negative.   Skin: Negative.   Allergic/Immunologic: Negative.   Neurological: Negative.   Hematological: Negative.   Psychiatric/Behavioral: Negative.       Objective:    Physical Exam Vitals and nursing note reviewed.  Constitutional:      General: She is not in acute distress.    Appearance: Normal appearance. She is normal weight. She is not ill-appearing, toxic-appearing or diaphoretic.  Cardiovascular:     Rate and Rhythm: Normal rate and regular rhythm.     Heart sounds: Normal heart sounds. No murmur heard.  No friction rub. No gallop.   Pulmonary:      Effort: Pulmonary effort is normal. No respiratory distress.     Breath sounds: Normal breath sounds. No stridor. No wheezing, rhonchi or rales.  Chest:     Chest wall: No tenderness.  Skin:    General: Skin is warm and dry.  Neurological:     General: No focal deficit present.     Mental Status: She is alert and oriented to person, place, and time. Mental status is at baseline.  Psychiatric:        Mood and Affect: Mood normal.  Behavior: Behavior normal.        Thought Content: Thought content normal.        Judgment: Judgment normal.     BP 117/87   Pulse 67   Temp 98.1 F (36.7 C) (Temporal)   Resp 18   Ht 5\' 8"  (1.727 m)   LMP 10/11/2019   SpO2 99%   BMI 42.15 kg/m  Wt Readings from Last 3 Encounters:  10/15/19 277 lb 3.2 oz (125.7 kg)  09/02/19 277 lb (125.6 kg)  06/21/19 281 lb (127.5 kg)     There are no preventive care reminders to display for this patient.  There are no preventive care reminders to display for this patient.  Lab Results  Component Value Date   TSH 1.150 06/14/2019   Lab Results  Component Value Date   WBC 4.4 06/14/2019   HGB 13.2 06/14/2019   HCT 39.1 06/14/2019   MCV 89 06/14/2019   PLT 257 06/14/2019   Lab Results  Component Value Date   NA 138 06/14/2019   K 4.4 06/14/2019   CO2 19 (L) 06/14/2019   GLUCOSE 94 06/14/2019   BUN 6 06/14/2019   CREATININE 0.94 06/14/2019   BILITOT 0.4 06/14/2019   ALKPHOS 53 06/14/2019   AST 26 06/14/2019   ALT 22 06/14/2019   PROT 7.1 06/14/2019   ALBUMIN 3.9 06/14/2019   CALCIUM 9.4 06/14/2019   ANIONGAP 8 05/23/2017   Lab Results  Component Value Date   CHOL 142 06/14/2019   Lab Results  Component Value Date   HDL 44 06/14/2019   Lab Results  Component Value Date   LDLCALC 77 06/14/2019   Lab Results  Component Value Date   TRIG 113 06/14/2019   Lab Results  Component Value Date   CHOLHDL 3.2 06/14/2019   Lab Results  Component Value Date   HGBA1C 5.6  03/09/2016      Assessment & Plan:   Problem List Items Addressed This Visit      Musculoskeletal and Integument   Hidradenitis suppurativa   Relevant Medications   valACYclovir (VALTREX) 1000 MG tablet   fluconazole (DIFLUCAN) 150 MG tablet    Other Visit Diagnoses    Candida infection    -  Primary   Relevant Medications   valACYclovir (VALTREX) 1000 MG tablet   fluconazole (DIFLUCAN) 150 MG tablet   Herpes simplex vulvovaginitis       Relevant Medications   valACYclovir (VALTREX) 1000 MG tablet   fluconazole (DIFLUCAN) 150 MG tablet      Meds ordered this encounter  Medications  . valACYclovir (VALTREX) 1000 MG tablet    Sig: Take 1 tablet (1,000 mg total) by mouth 2 (two) times daily.    Dispense:  20 tablet    Refill:  2  . fluconazole (DIFLUCAN) 150 MG tablet    Sig: Take 1 tablet (150 mg total) by mouth once for 1 dose.    Dispense:  1 tablet    Refill:  0    Order Specific Question:   Supervising Provider    Answer:   Carlota Raspberry, JEFFREY R [[id number]]    Follow-up: No follow-ups on file.   PLAN  sterapred dose pack to help with inflammation around HS  Refill valtrex for PRN use  Diflucan 1 tab sent  Histories reviewed  Return prn  Patient encouraged to call clinic with any questions, comments, or concerns.  Maximiano Coss, NP

## 2019-11-05 NOTE — Patient Instructions (Signed)
° ° ° °  If you have lab work done today you will be contacted with your lab results within the next 2 weeks.  If you have not heard from us then please contact us. The fastest way to get your results is to register for My Chart. ° ° °IF you received an x-ray today, you will receive an invoice from [facility]. Please contact [facility] at [phone] with questions or concerns regarding your invoice.  ° °IF you received labwork today, you will receive an invoice from [facility]. Please contact [facility] at [phone] with questions or concerns regarding your invoice.  ° °Our billing staff will not be able to assist you with questions regarding bills from these companies. ° °You will be contacted with the lab results as soon as they are available. The fastest way to get your results is to activate your My Chart account. Instructions are located on the last page of this paperwork. If you have not heard from us regarding the results in 2 weeks, please contact this office. °  ° ° ° °

## 2019-11-12 ENCOUNTER — Encounter: Payer: Self-pay | Admitting: Registered Nurse

## 2020-03-25 IMAGING — MG DIGITAL DIAGNOSTIC BILATERAL MAMMOGRAM WITH TOMO AND CAD
6 of 10 series · 6 of 30 positions shown · non-contrast
Comparison: None.
COMPARISON: None.

Addendum:
CLINICAL DATA: Patient presents with focal pain in the left breast
medially. No reported lumps.

EXAM:
DIGITAL DIAGNOSTIC BILATERAL MAMMOGRAM WITH CAD AND TOMO
ULTRASOUND LEFT BREAST

[R CC synth-2D]
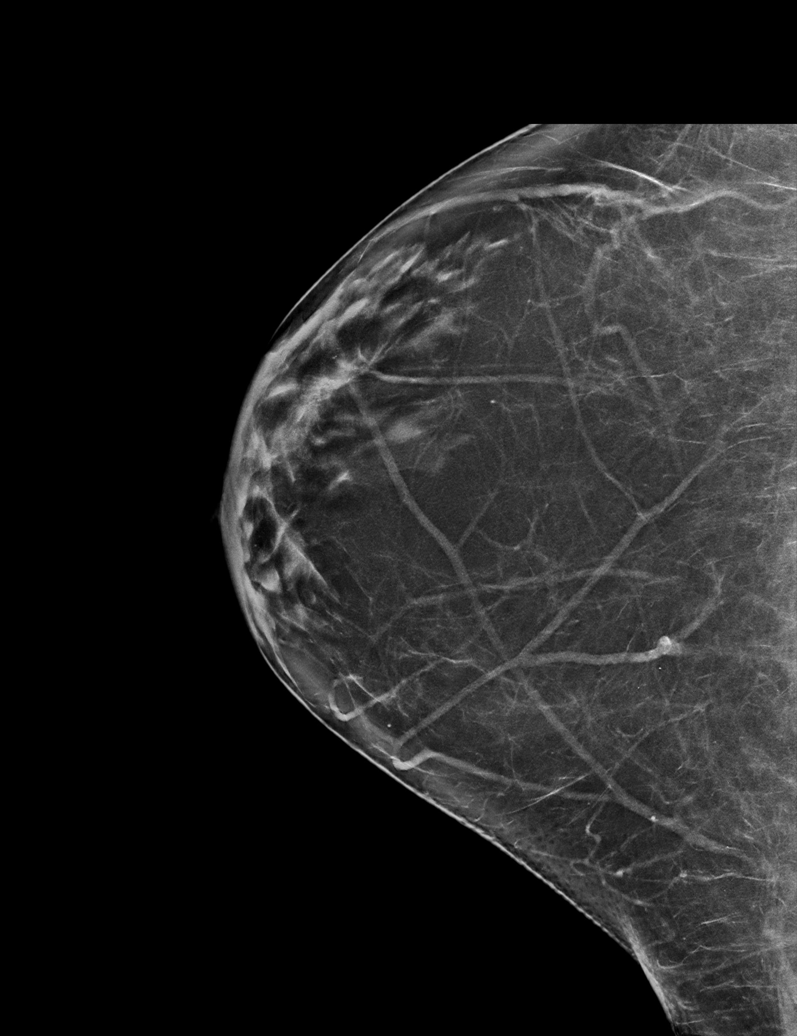

[L CC synth-2D (1 of 2)]
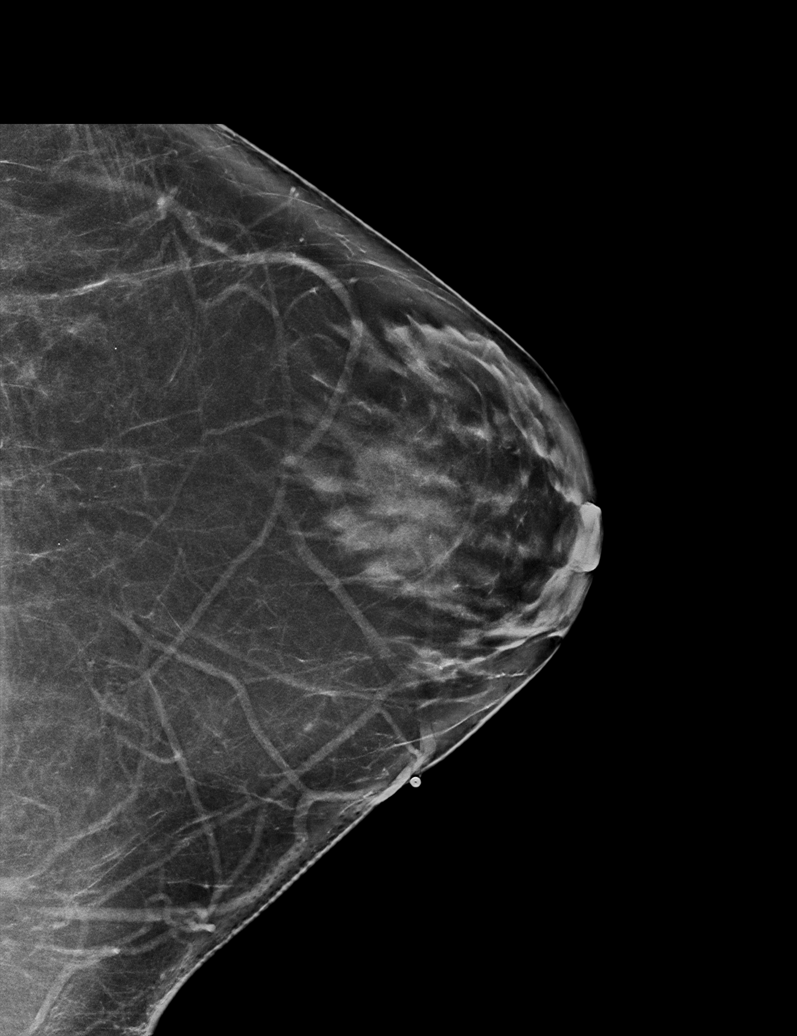

[L MLO synth-2D]
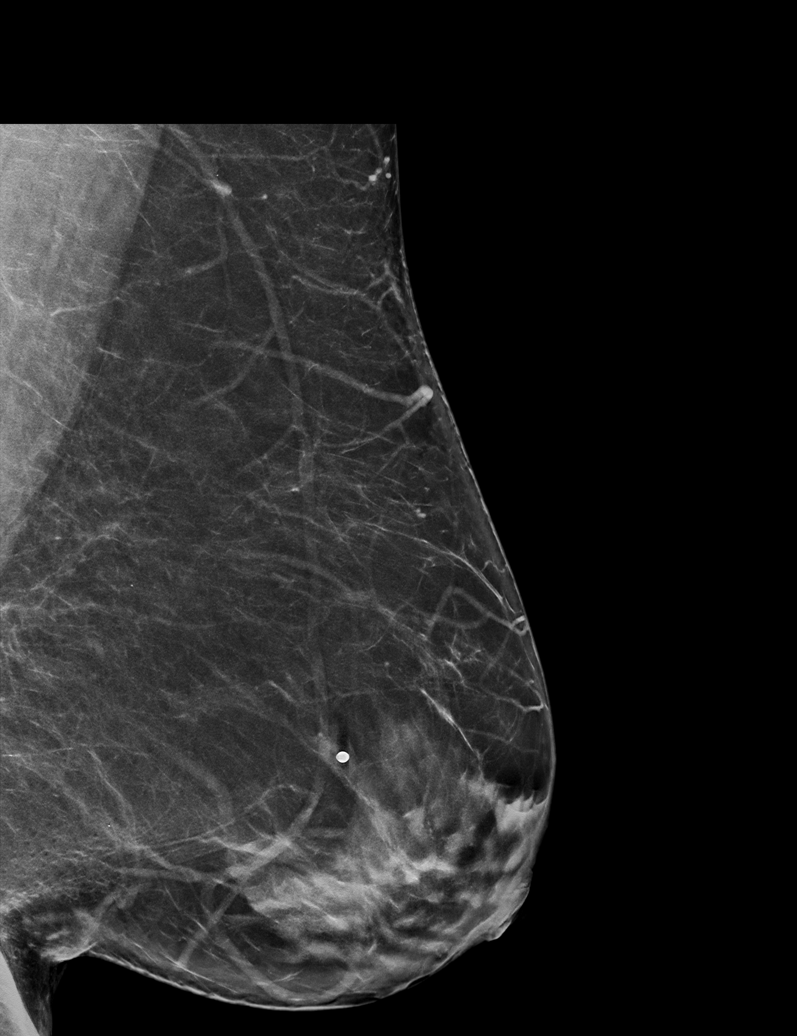

[L CC synth-2D (2 of 2)]
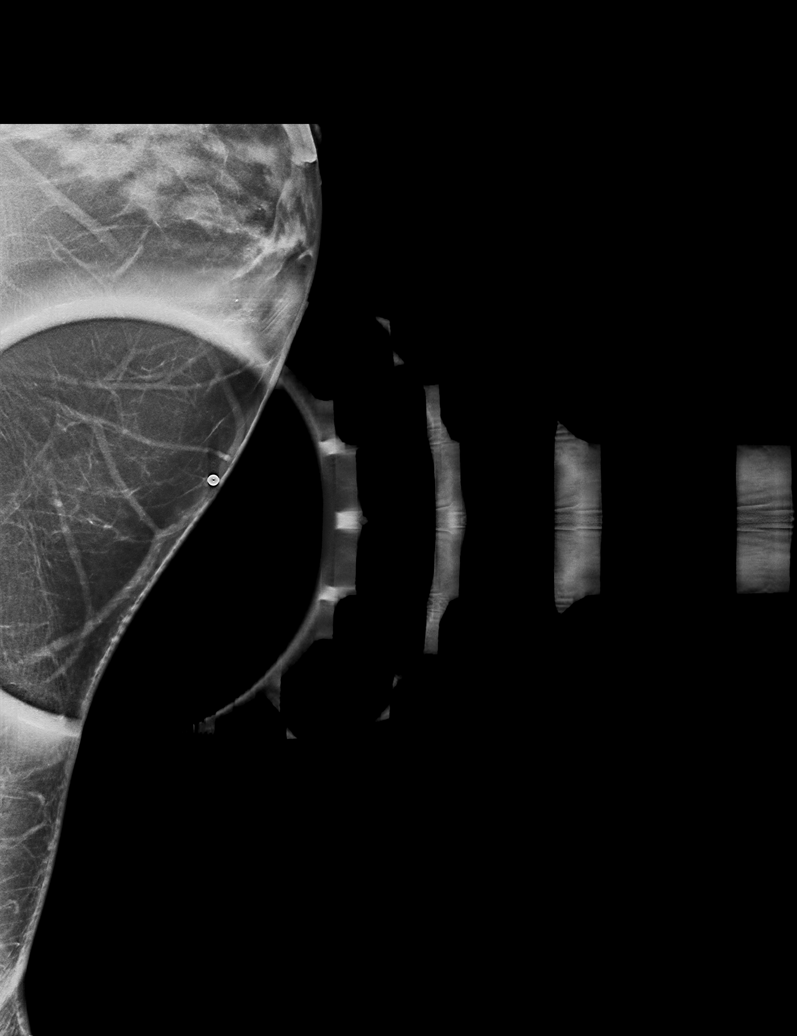

[R MLO synth-2D]
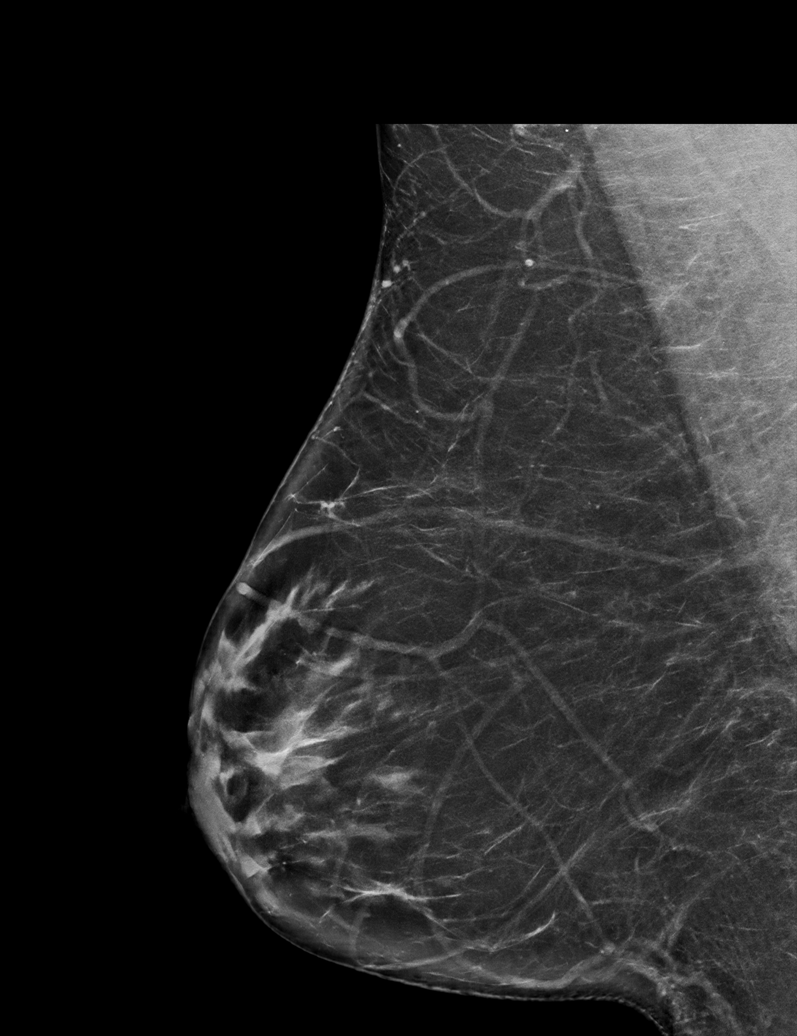

[L CC tomo · tomo slice 38/75.0]
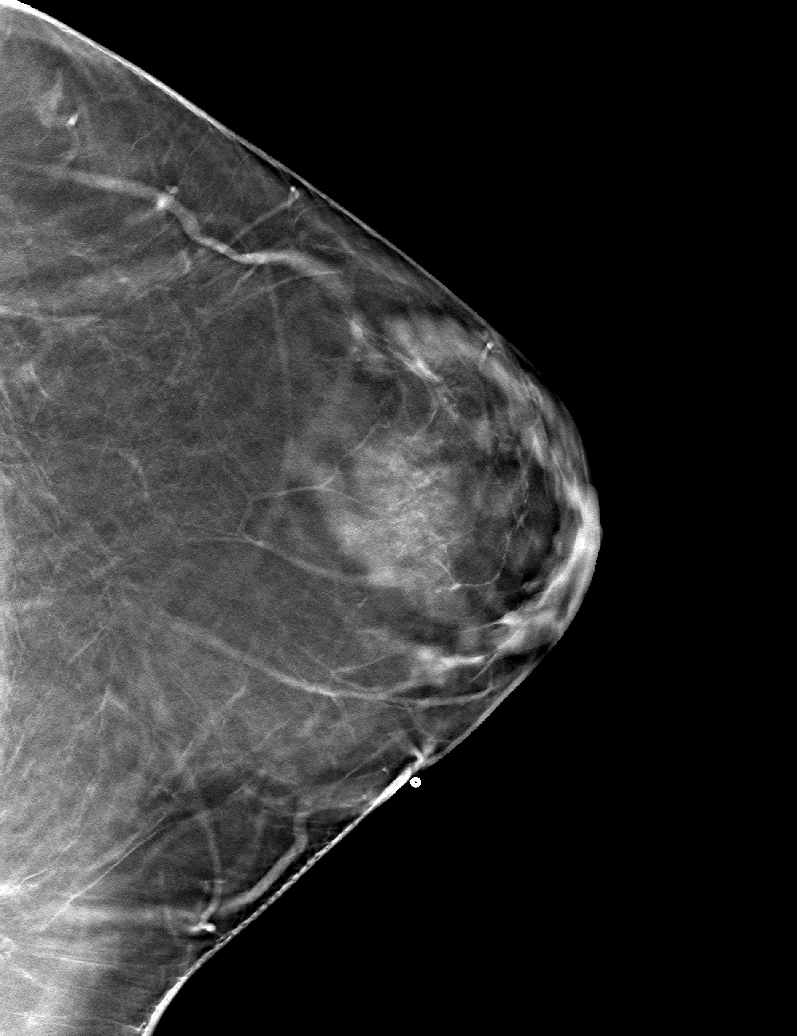

[6 of 30 positions shown; findings below may reference images not displayed]

ACR Breast Density Category b: There are scattered areas of
fibroglandular density.
FINDINGS: There are no masses, no areas of architectural distortion, no areas
of significant asymmetry and no suspicious calcifications.

Mammographic images were processed with CAD.

On physical exam, no mass is palpated along the medial left breast.
Patient is mildly tender to palpation.

Targeted ultrasound is performed, showing normal fibroglandular
tissue throughout the medial aspect of the right breast in the
location of the focal breast pain. No mass or suspicious lesion.
IMPRESSION: Negative exam.  No evidence of breast malignancy.

RECOMMENDATION:
Screening mammogram in one year.(Code:IM-1-A14)

I have discussed the findings and recommendations with the patient.
Results were also provided in writing at the conclusion of the
visit. If applicable, a reminder letter will be sent to the patient
regarding the next appointment.

BI-RADS CATEGORY  1: Negative.

ADDENDUM:
There is an error in the body of the report. Under the ultrasound
findings, it should read "normal fibroglandular tissue throughout
the medial aspect of the left breast in the location of the focal
pain", not the right breast.

*** End of Addendum ***
ACR Breast Density Category b: There are scattered areas of
fibroglandular density.
FINDINGS: There are no masses, no areas of architectural distortion, no areas
of significant asymmetry and no suspicious calcifications.

Mammographic images were processed with CAD.

On physical exam, no mass is palpated along the medial left breast.
Patient is mildly tender to palpation.

Targeted ultrasound is performed, showing normal fibroglandular
tissue throughout the medial aspect of the right breast in the
location of the focal breast pain. No mass or suspicious lesion.
IMPRESSION: Negative exam.  No evidence of breast malignancy.

RECOMMENDATION:
Screening mammogram in one year.(Code:IM-1-A14)

I have discussed the findings and recommendations with the patient.
Results were also provided in writing at the conclusion of the
visit. If applicable, a reminder letter will be sent to the patient
regarding the next appointment.

BI-RADS CATEGORY  1: Negative.

## 2020-04-29 ENCOUNTER — Emergency Department (HOSPITAL_COMMUNITY): Payer: Self-pay

## 2020-04-29 ENCOUNTER — Other Ambulatory Visit: Payer: Self-pay

## 2020-04-29 ENCOUNTER — Emergency Department (HOSPITAL_COMMUNITY)
Admission: EM | Admit: 2020-04-29 | Discharge: 2020-04-29 | Disposition: A | Discharge status: Home / Self Care | Payer: Self-pay | Attending: Emergency Medicine | Admitting: Emergency Medicine

## 2020-04-29 DIAGNOSIS — M5412 Radiculopathy, cervical region: Secondary | ICD-10-CM | POA: Insufficient documentation

## 2020-04-29 DIAGNOSIS — F17210 Nicotine dependence, cigarettes, uncomplicated: Secondary | ICD-10-CM | POA: Diagnosis not present

## 2020-04-29 DIAGNOSIS — R079 Chest pain, unspecified: Secondary | ICD-10-CM | POA: Insufficient documentation

## 2020-04-29 DIAGNOSIS — M542 Cervicalgia: Secondary | ICD-10-CM | POA: Diagnosis present

## 2020-04-29 DIAGNOSIS — J45909 Unspecified asthma, uncomplicated: Secondary | ICD-10-CM | POA: Insufficient documentation

## 2020-04-29 LAB — BASIC METABOLIC PANEL
Anion gap: 8 (ref 5–15)
BUN: 8 mg/dL (ref 6–20)
CO2: 24 mmol/L (ref 22–32)
Calcium: 9.6 mg/dL (ref 8.9–10.3)
Chloride: 104 mmol/L (ref 98–111)
Creatinine, Ser: 1.08 mg/dL — ABNORMAL HIGH (ref 0.44–1.00)
GFR, Estimated: >60 mL/min (ref >60)
Glucose, Bld: 98 mg/dL (ref 70–99)
Potassium: 3.9 mmol/L (ref 3.5–5.1)
Sodium: 136 mmol/L (ref 135–145)

## 2020-04-29 LAB — CBC
HCT: 41.8 % (ref 36.0–46.0)
Hemoglobin: 14.2 g/dL (ref 12.0–15.0)
MCH: 31.7 pg (ref 26.0–34.0)
MCHC: 34 g/dL (ref 30.0–36.0)
MCV: 93.3 fL (ref 80.0–100.0)
Platelets: 222 10*3/uL (ref 150–400)
RBC: 4.48 MIL/uL (ref 3.87–5.11)
RDW: 13 % (ref 11.5–15.5)
WBC: 6 10*3/uL (ref 4.0–10.5)
nRBC: 0 % (ref 0.0–0.2)

## 2020-04-29 LAB — I-STAT BETA HCG BLOOD, ED (MC, WL, AP ONLY): I-stat hCG, quantitative: <5 m[IU]/mL (ref <5)

## 2020-04-29 LAB — TROPONIN I (HIGH SENSITIVITY): Troponin I (High Sensitivity): 4 ng/L (ref <18)

## 2020-04-29 MED ORDER — IBUPROFEN 600 MG PO TABS: 600.0000 mg | ORAL_TABLET | Freq: Four times a day (QID) | ORAL | 0 refills | Status: AC | PRN

## 2020-04-29 MED ORDER — ACETAMINOPHEN 325 MG PO TABS: 650.0000 mg | ORAL_TABLET | Freq: Four times a day (QID) | ORAL | 0 refills | Status: AC | PRN

## 2020-04-29 MED ORDER — PREDNISONE 10 MG PO TABS: 20.0000 mg | ORAL_TABLET | Freq: Every day | ORAL | 0 refills | Status: AC

## 2020-04-29 NOTE — ED Notes (Signed)
Chest and shoulder pain for one month some sob today

## 2020-04-29 NOTE — ED Triage Notes (Signed)
Pt with L sided neck pain with radiation down L arm x several weeks. Over the last week has additionally had left sided chest pain and numbness down L arm, worsening today. Endorses shob.

## 2020-04-29 NOTE — ED Provider Notes (Signed)
[facility] EMERGENCY DEPARTMENT Provider Note   CSN: [id number] Arrival date & time: 04/29/20  1643     History Chief Complaint  Patient presents with  . Chest Pain  . Neck Pain    Sydney Beck is a 45 y.o. female w/ hx of obesity, asthma, presented to emergency department with left-sided chest, neck, shoulder pain and arm pain.  She reports symptoms been ongoing for several weeks but she feels like they worsened in the past few days.  She described she has chronic pain in the left side of her neck occasionally gets migraines, but does feel like the pain is worsened in her left shoulder and also radiating down her left arms at times.  She describes tingling in her left fingers.  She also describes a wraparound pressure sensation left side of her chest with the symptoms.  They seem to be worse with specific head movements laterally.  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  She denies any known history of cardiac disease.  She is a smoker, but denies history of diabetes, high cholesterol, or family hx of MI under age 88.   HPI     Past Medical History:  Diagnosis Date  . Anxiety   . Depression     Patient Active Problem List   Diagnosis Date Noted  . Hidradenitis suppurativa 11/05/2019  . Routine screening for STI (sexually transmitted infection) 06/20/2019  . Fatigue 06/20/2019  . Annual physical exam 06/20/2019  . Asthma due to seasonal allergies 06/20/2019  . Encounter for medical examination to establish care 06/20/2019  . Nausea and vomiting 06/11/2019  . Allergic rhinitis 03/09/2016  . Moderate major depression, single episode ([place]) 03/09/2016    Past Surgical History:  Procedure Laterality Date  . BREAST CYST ASPIRATION Right    age 62 cyst aspirated  . CYST REMOVAL NECK    . INGUINAL HIDRADENITIS EXCISION    . KNEE  SURGERY Right      OB History    Gravida  0   Para  0   Term  0   Preterm  0   AB  0   Living  0     SAB  0   IAB  0   Ectopic  0   Multiple  0   Live Births  0           Family History  Problem Relation Age of Onset  . Diabetes Father   . Depression Father   . Anxiety disorder Father   . Dementia Father   . Anxiety disorder Mother   . Depression Mother   . Schizophrenia Brother   . Alcohol abuse Brother   . Drug abuse Brother   . Depression Brother   . Anxiety disorder Brother     Social History   Tobacco Use  . Smoking status: Light Tobacco Smoker    Types: Cigarettes  . Smokeless tobacco: Never Used  . Tobacco comment: socical - black and milds  Vaping Use  . Vaping Use: Never used  Substance Use Topics  . Alcohol use: Yes    Alcohol/week: 20.0 standard drinks    Types: 15 Glasses of wine, 5 Shots of liquor per week  . Drug use: Yes    Types: Marijuana    Comment: used on a weekly basis    Home  Medications Prior to Admission medications   Medication Sig Start Date End Date Taking? Authorizing Provider  acetaminophen (TYLENOL) 325 MG tablet Take 2 tablets (650 mg total) by mouth every 6 (six) hours as needed for up to 30 doses for mild pain or moderate pain. 04/29/20  Yes Logan Baltimore, Carola Rhine, MD  ibuprofen (ADVIL) 600 MG tablet Take 1 tablet (600 mg total) by mouth every 6 (six) hours as needed for headache or moderate pain. 04/29/20 05/29/20 Yes Eiley Mcginnity, Carola Rhine, MD  predniSONE (DELTASONE) 10 MG tablet Take 2 tablets (20 mg total) by mouth daily with breakfast for 5 days. 04/30/20 05/05/20 Yes Spenser Cong, Carola Rhine, MD  albuterol (VENTOLIN HFA) 108 (90 Base) MCG/ACT inhaler Inhale 2 puffs into the lungs every 4 (four) hours as needed for wheezing or shortness of breath (cough, shortness of breath or wheezing.). 06/04/19   Wendall Mola, NP  cetirizine (ZYRTEC) 10 MG tablet Take 1 tablet (10 mg total) by mouth daily. 06/14/19   Wendall Mola,  NP  ciprofloxacin (CIPRO) 500 MG tablet Take 1 tablet (500 mg total) by mouth 2 (two) times daily. Patient not taking: Reported on 11/05/2019 10/15/19   Maximiano Coss, NP  clindamycin (CLEOCIN) 300 MG capsule Take 1 capsule (300 mg total) by mouth 3 (three) times daily. Patient not taking: Reported on 11/05/2019 10/15/19   Maximiano Coss, NP  clotrimazole-betamethasone (LOTRISONE) cream Apply 1 application topically 2 (two) times daily. 10/11/19   Maximiano Coss, NP  nystatin (MYCOSTATIN/NYSTOP) powder Apply topically 4 (four) times daily. 08/25/17   [name] D, PA-C  predniSONE (STERAPRED UNI-PAK 21 TAB) 10 MG (21) TBPK tablet Take per package instructions. Do not skip doses. Finish entire supply. 11/05/19   Maximiano Coss, NP  valACYclovir (VALTREX) 1000 MG tablet Take 1 tablet (1,000 mg total) by mouth 2 (two) times daily. 11/05/19   Maximiano Coss, NP    Allergies    Other, Bee venom, Oxycodone hcl, and Sulfa antibiotics  Review of Systems   Review of Systems  Constitutional: Negative for chills and fever.  Eyes: Negative for pain and visual disturbance.  Respiratory: Negative for cough and shortness of breath.   Cardiovascular: Positive for chest pain. Negative for palpitations.  Gastrointestinal: Negative for abdominal pain and vomiting.  Musculoskeletal: Positive for arthralgias and myalgias.  Skin: Negative for color change and rash.  Neurological: Positive for numbness. Negative for syncope.  Psychiatric/Behavioral: Negative for agitation.  All other systems reviewed and are negative.   Physical Exam Updated Vital Signs BP 140/80   Pulse 72   Temp 98 F (36.7 C)   Resp 18   LMP 04/22/2020 (Approximate)   SpO2 100%   Physical Exam Constitutional:      General: She is not in acute distress.    Appearance: She is obese.  HENT:     Head: Normocephalic and atraumatic.  Eyes:     Conjunctiva/sclera: Conjunctivae normal.     Pupils: Pupils are equal, round, and  reactive to light.  Cardiovascular:     Rate and Rhythm: Normal rate and regular rhythm.  Pulmonary:     Effort: Pulmonary effort is normal. No respiratory distress.  Abdominal:     General: There is no distension.     Tenderness: There is no abdominal tenderness.  Musculoskeletal:     Comments: Diffuse trigger point tenderness of left trapezius Positive spurling test Pain reproduced with left lateral arm raise  Skin:    General: Skin is warm and dry.  Neurological:     General: No focal deficit present.     Mental Status: She is alert and oriented to person, place, and time. Mental status is at baseline.     Cranial Nerves: No cranial nerve deficit.     Motor: No weakness.  Psychiatric:        Mood and Affect: Mood normal.        Behavior: Behavior normal.     ED Results / Procedures / Treatments   Labs (all labs ordered are listed, but only abnormal results are displayed) Labs Reviewed  BASIC METABOLIC PANEL - Abnormal; Notable for the following components:      Result Value   Creatinine, Ser 1.08 (*)    All other components within normal limits  CBC  I-STAT BETA HCG BLOOD, ED ([facility], [facility], [facility] ONLY)  TROPONIN I (HIGH SENSITIVITY)    EKG EKG Interpretation  Date/Time:  Wednesday April 29 2020 16:57:36 EDT Ventricular Rate:  98 PR Interval:  150 QRS Duration: 70 QT Interval:  326 QTC Calculation: 416 R Axis:   38 Text Interpretation: Normal sinus rhythm Cannot rule out Anterior infarct , age undetermined Abnormal ECG No STEMI Confirmed by Octaviano Glow [phone]) on 04/29/2020 5:56:53 PM   Radiology DG Chest 2 View  Result Date: 04/29/2020 CLINICAL DATA:  Shortness of breath LEFT neck pain. EXAM: CHEST - 2 VIEW COMPARISON:  None. FINDINGS: Trachea midline. Cardiomediastinal contours and hilar structures are normal. Lungs are clear.  No sign of pleural effusion. On limited assessment no acute skeletal process. IMPRESSION: No active cardiopulmonary disease. Electronically  Signed   By: Zetta Bills M.D.   On: 04/29/2020 17:57    Procedures Procedures   Medications Ordered in ED Medications - No data to display  ED Course  I have reviewed the triage vital signs and the nursing notes.  Pertinent labs & imaging results that were available during my care of the patient were reviewed by me and considered in my medical decision making (see chart for details).  45 year old here with left arm pain and chest pain for several weeks, worsening recently.  History and clinical exam by my suspect that this is muscle spasm with radiculopathy.  She may have some irritation of the brachial plexus and possible cervical angina.  Her symptoms are reproducible with arm movement and with palpation.  There may be some muscle spasm.  I have a lower suspicion for acute coronary syndrome or other cardiac disease, given that she has few risk factors and heart score of 2.  Likewise have a lower suspicion for pneumothorax, PE.  Labs reviewed.  Trop 4 after multiple days of symptoms - doubt ACS.  BMP and CBC unremarkable.  ECG shows NSR without acute ischemic findings.  Will start on prednisone x 5 days, motrin, tylenol, okay to d/c.   Final Clinical Impression(s) / ED Diagnoses Final diagnoses:  Neck pain  Cervical radiculopathy  Chest pain, unspecified type    Rx / DC Orders ED Discharge Orders         Ordered    predniSONE (DELTASONE) 10 MG tablet  Daily with breakfast        04/29/20 2002    ibuprofen (ADVIL) 600 MG tablet  Every 6 hours PRN        04/29/20 2002    acetaminophen (TYLENOL) 325 MG tablet  Every 6 hours PRN        04/29/20 2002  Wyvonnia Dusky, MD 04/29/20 [phone]

## 2020-05-28 NOTE — Telephone Encounter (Signed)
Opened in error

## 2020-11-23 DIAGNOSIS — L732 Hidradenitis suppurativa: Secondary | ICD-10-CM | POA: Diagnosis not present

## 2020-11-23 DIAGNOSIS — Z1331 Encounter for screening for depression: Secondary | ICD-10-CM | POA: Diagnosis not present

## 2020-11-23 DIAGNOSIS — F17200 Nicotine dependence, unspecified, uncomplicated: Secondary | ICD-10-CM | POA: Diagnosis not present

## 2020-11-23 DIAGNOSIS — Z131 Encounter for screening for diabetes mellitus: Secondary | ICD-10-CM | POA: Diagnosis not present

## 2020-11-23 DIAGNOSIS — Z114 Encounter for screening for human immunodeficiency virus [HIV]: Secondary | ICD-10-CM | POA: Diagnosis not present

## 2020-11-23 DIAGNOSIS — Z1339 Encounter for screening examination for other mental health and behavioral disorders: Secondary | ICD-10-CM | POA: Diagnosis not present

## 2020-11-23 DIAGNOSIS — Z1329 Encounter for screening for other suspected endocrine disorder: Secondary | ICD-10-CM | POA: Diagnosis not present

## 2020-11-23 DIAGNOSIS — Z136 Encounter for screening for cardiovascular disorders: Secondary | ICD-10-CM | POA: Diagnosis not present

## 2020-11-23 DIAGNOSIS — K625 Hemorrhage of anus and rectum: Secondary | ICD-10-CM | POA: Diagnosis not present

## 2020-11-23 DIAGNOSIS — Z1159 Encounter for screening for other viral diseases: Secondary | ICD-10-CM | POA: Diagnosis not present

## 2020-11-23 DIAGNOSIS — B3731 Acute candidiasis of vulva and vagina: Secondary | ICD-10-CM | POA: Diagnosis not present

## 2020-11-23 DIAGNOSIS — F1010 Alcohol abuse, uncomplicated: Secondary | ICD-10-CM | POA: Diagnosis not present

## 2020-11-26 ENCOUNTER — Encounter: Payer: Self-pay | Admitting: Nurse Practitioner

## 2020-12-17 ENCOUNTER — Encounter: Payer: Self-pay | Admitting: Nurse Practitioner

## 2020-12-17 ENCOUNTER — Ambulatory Visit (INDEPENDENT_AMBULATORY_CARE_PROVIDER_SITE_OTHER): Payer: BC Managed Care – PPO | Admitting: Nurse Practitioner

## 2020-12-17 VITALS — BP 118/80 | HR 83 | Ht 68.0 in | Wt 296.5 lb

## 2020-12-17 DIAGNOSIS — R1012 Left upper quadrant pain: Secondary | ICD-10-CM

## 2020-12-17 DIAGNOSIS — K625 Hemorrhage of anus and rectum: Secondary | ICD-10-CM

## 2020-12-17 DIAGNOSIS — F1010 Alcohol abuse, uncomplicated: Secondary | ICD-10-CM

## 2020-12-17 NOTE — Patient Instructions (Addendum)
If you are age 44 or younger, your body mass index should be between 19-25. Your Body mass index is 45.08 kg/m. If this is out of the aformentioned range listed, please consider follow up with your Primary Care Provider.   The [facility] providers would like to encourage you to use [facility] to communicate with providers for non-urgent requests or questions.  Due to long hold times on the telephone, sending your provider a message by [facility] may be faster and more efficient way to get a response. Please allow 48 business hours for a response.  Please remember that this is for non-urgent requests/questions.  PROCEDURES: You have been scheduled for an EGD and Colonoscopy. Please follow the written instructions given to you at your visit today. If you use inhalers (even only as needed), please bring them with you on the day of your procedure.  RECOMMENDATIONS: Omeprazole 40 MG every morning 30 minutes before breakfast. This is over the counter.  It was great seeing you today! Thank you for entrusting me with your care and choosing [facility].  Tye Savoy, NP

## 2020-12-17 NOTE — Progress Notes (Signed)
ASSESSMENT AND PLAN    # 45 yo female with chronic, intermittent painless rectal bleeding with bowel movements.  She gives a history of hemorrhoids.  Bleeding is probably hemorrhoidal in nature but need to exclude other etiologies such as polyps / neoplasm --She reports normal bowel movements without straining --Schedule for a colonoscopy. The risks and benefits of colonoscopy with possible polypectomy / biopsies were discussed and the patient agrees to proceed.   # Chronic, intermittent,  LUQ  discomfort strongly correlating with EtOH intake.  Low suspicion for chronic pancreatitis though we discussed the increased risk of it,  as well  as increased risk of liver disease, related to her heavy Etoh use.  Pain could be secondary to alcohol related gastritis, PUD  --Start Omeprazole 40 mg qam 30 minutes before breakfast --Schedule for EGD to be done at time of colonoscopy. The risks and benefits of EGD with possible biopsies were discussed with the patient who agrees to proceed.   # Etoh abuse, longstanding.  Patient is very aware of her excessive alcohol intake.  She is seeing a therapist for depression and anxiety.  -- Strongly encouraged her to start slowly reducing the amount of alcohol she drinks.  Advised against abrupt cessation as this could lead to withdrawals --Thankfully her liver chemistries are normal.   #Tobacco use.  PCP has prescribed a nicotine patch and patient intends to use it.    HISTORY OF PRESENT ILLNESS     Chief Complaint : rectal bleeding, pain in left abdomen  Sydney Beck is a 45 y.o. female with a past medical history significant for anxiety, depression, Etoh abuse, tobacco abuse, cannabis use, arthritis, allergy induced asthma. See PMH below for any additional history.   Referred by PCP for rectal bleeding. I do not have records and nothing is visible in Care Everywhere but she pulled up recent labs on her phone.   [name] has been having intermittent  painless rectal bleeding with bowel movement for years.  She gives a history of hemorrhoids . She has a bowel movement daily and stools are not hard, she doesn't strain. She had never had a colonoscopy.    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  She has been under a lot of stress having recently taken care of of her ill father who recently passed away . She is worried about having a gastric ulcer. She has had intermittent,  non-radiating, dull pain in her LUQ for years.   She doesn't take NSAIDS on a regular basis, maybe 3-4 days a month for menstrual cramps. The pain has no relation to food or physical activity but she has noticed  that is correlates with alcohol intake.  In July she stopped drinking for about a week, took a few doses of Omeprazole and had improvement in the LUQ pain .  She gives a several year history of alcohol abuse . She drinks about 8 to 10 oz of vodka a day or 7-8 glasses of wine a day. She used to drink more than that.  She gives a history of anxiety / depression and is seeing a therapist but isn't treated with medication.    Data Reviewed: 11/23/09 PCP's lab ( patient pulled up on phone) TSH 1.28 Hgb 13.6, MCV 91, platelets 183 Creatinine 0.9 Albumin 4.4, Tbili 0.3, alk phos 68, AST 26, ALT 29   PREVIOUS GI EVALUATIONS:     Past Medical History:  Diagnosis Date   Alcoholism ([place])  Anxiety    Asthma    Depression     Past Surgical History:  Procedure Laterality Date   BREAST CYST ASPIRATION Right    age 47 cyst aspirated   CYST REMOVAL NECK     INGUINAL HIDRADENITIS EXCISION     KNEE SURGERY Right    Family History  Problem Relation Age of Onset   Diabetes Father    Depression Father    Anxiety disorder Father    Dementia Father    Anxiety disorder Mother    Depression Mother    Schizophrenia Brother    Alcohol abuse Brother    Drug abuse Brother    Depression Brother    Anxiety disorder Brother    Social History   Tobacco Use    Smoking status: Light Smoker    Types: Cigarettes   Smokeless tobacco: Never   Tobacco comments:    socical - black and milds  Vaping Use   Vaping Use: Never used  Substance Use Topics   Alcohol use: Yes    Alcohol/week: 20.0 standard drinks    Types: 15 Glasses of wine, 5 Shots of liquor per week   Drug use: Yes    Types: Marijuana    Comment: used on a weekly basis   Current Outpatient Medications  Medication Sig Dispense Refill   acetaminophen (TYLENOL) 325 MG tablet Take 2 tablets (650 mg total) by mouth every 6 (six) hours as needed for up to 30 doses for mild pain or moderate pain. 30 tablet 0   albuterol (VENTOLIN HFA) 108 (90 Base) MCG/ACT inhaler Inhale 2 puffs into the lungs every 4 (four) hours as needed for wheezing or shortness of breath (cough, shortness of breath or wheezing.). 6.7 g 1   cetirizine (ZYRTEC) 10 MG tablet Take 1 tablet (10 mg total) by mouth daily. 90 tablet 3   clotrimazole-betamethasone (LOTRISONE) cream Apply 1 application topically 2 (two) times daily. 30 g 2   predniSONE (STERAPRED UNI-PAK 21 TAB) 10 MG (21) TBPK tablet Take per package instructions. Do not skip doses. Finish entire supply. 1 each 0   No current facility-administered medications for this visit.   Allergies  Allergen Reactions   Other Hives and Swelling   Bee Venom Swelling    Other reaction(s): itching /swelling   Oxycodone Hcl Itching    Other reaction(s): itching   Sulfa Antibiotics     Other reaction(s): itching Itching, hives     Review of Systems: Positive for allergy, sinus trouble, anxiety, arthritis, back pain, fatigue, menstrual pain, shortness of breath, sleeping problems, swelling of feet and legs.  All other systems reviewed and negative except where noted in HPI.    PHYSICAL EXAM :    Wt Readings from Last 3 Encounters:  12/17/20 296 lb 8 oz (134.5 kg)  10/15/19 277 lb 3.2 oz (125.7 kg)  09/02/19 277 lb (125.6 kg)    BP 118/80   Pulse 83   Ht _0   (1.727 m)   Wt 296 lb 8 oz (134.5 kg)   SpO2 99%   BMI 45.08 kg/m  Constitutional:  Generally well appearing female in no acute distress. BMI 45 Psychiatric: Very pleasant. Normal mood and affect. Behavior is normal. EENT: Pupils normal.  Conjunctivae are normal. No scleral icterus. Neck supple.  Cardiovascular: Normal rate, regular rhythm. No edema Pulmonary/chest: Effort normal and breath sounds normal. No wheezing, rales or rhonchi. Abdominal: Soft, nondistended, nontender. Bowel sounds active throughout. There are no masses palpable. No  hepatomegaly. Neurological: Alert and oriented to person place and time. Skin: Skin is warm and dry. No rashes noted.  Tye Savoy, NP  12/17/2020, 2:44 PM  Cc:  Referring Provider Selina Cooley, FNP

## 2020-12-20 NOTE — Progress Notes (Signed)
Reviewed and agree with management plans. ? ?Sydney Beck L. Sydney Tondreau, MD, MPH  ?

## 2021-01-18 ENCOUNTER — Encounter: Payer: Self-pay | Admitting: Gastroenterology

## 2021-01-22 ENCOUNTER — Ambulatory Visit (AMBULATORY_SURGERY_CENTER): Payer: BC Managed Care – PPO | Admitting: Gastroenterology

## 2021-01-22 ENCOUNTER — Encounter: Payer: Self-pay | Admitting: Gastroenterology

## 2021-01-22 ENCOUNTER — Other Ambulatory Visit: Payer: Self-pay

## 2021-01-22 VITALS — BP 108/54 | HR 70 | Temp 95.5°F | Resp 19 | Ht 68.0 in | Wt 296.0 lb

## 2021-01-22 DIAGNOSIS — K625 Hemorrhage of anus and rectum: Secondary | ICD-10-CM | POA: Diagnosis not present

## 2021-01-22 DIAGNOSIS — K2100 Gastro-esophageal reflux disease with esophagitis, without bleeding: Secondary | ICD-10-CM

## 2021-01-22 DIAGNOSIS — R1012 Left upper quadrant pain: Secondary | ICD-10-CM

## 2021-01-22 DIAGNOSIS — D125 Benign neoplasm of sigmoid colon: Secondary | ICD-10-CM

## 2021-01-22 DIAGNOSIS — K635 Polyp of colon: Secondary | ICD-10-CM | POA: Diagnosis not present

## 2021-01-22 DIAGNOSIS — K200 Eosinophilic esophagitis: Secondary | ICD-10-CM

## 2021-01-22 DIAGNOSIS — K2289 Other specified disease of esophagus: Secondary | ICD-10-CM | POA: Diagnosis not present

## 2021-01-22 DIAGNOSIS — K2970 Gastritis, unspecified, without bleeding: Secondary | ICD-10-CM | POA: Diagnosis not present

## 2021-01-22 MED ORDER — AMBULATORY NON FORMULARY MEDICATION: 1.0000 "application " | Freq: Four times a day (QID) | 0 refills | Status: DC

## 2021-01-22 MED ORDER — OMEPRAZOLE 40 MG PO CPDR
40.0000 mg | DELAYED_RELEASE_CAPSULE | Freq: Two times a day (BID) | ORAL | 3 refills | Status: AC
Start: 2021-01-22 — End: ?

## 2021-01-22 MED ORDER — SODIUM CHLORIDE 0.9 % IV SOLN: 500.0000 mL | Freq: Once | INTRAVENOUS | Status: DC

## 2021-01-22 MED ORDER — AMBULATORY NON FORMULARY MEDICATION: 1.0000 | Freq: Four times a day (QID) | 0 refills | Status: AC

## 2021-01-22 NOTE — Progress Notes (Signed)
VS by DT  Pt's states no medical or surgical changes since previsit or office visit.  

## 2021-01-22 NOTE — Progress Notes (Signed)
Called to room to assist during endoscopic procedure.  Patient ID and intended procedure confirmed with present staff. Received instructions for my participation in the procedure from the performing physician.  

## 2021-01-22 NOTE — Op Note (Signed)
[facility] Patient Name: Sydney Beck Procedure Date: 01/22/2021 1:59 PM MRN: [id number] Endoscopist: Thornton Park MD, MD Age: 45 Referring MD:  Date of Birth: [birth year] Gender: Female Account #: [id number] Procedure:                Upper GI endoscopy Indications:              Abdominal pain in the left upper quadrant Medicines:                Monitored Anesthesia Care Procedure:                Pre-Anesthesia Assessment:                           - Prior to the procedure, a History and Physical                            was performed, and patient medications and                            allergies were reviewed. The patient's tolerance of                            previous anesthesia was also reviewed. The risks                            and benefits of the procedure and the sedation                            options and risks were discussed with the patient.                            All questions were answered, and informed consent                            was obtained. Prior Anticoagulants: The patient has                            taken no previous anticoagulant or antiplatelet                            agents. ASA Grade Assessment: III - A patient with                            severe systemic disease. After reviewing the risks                            and benefits, the patient was deemed in                            satisfactory condition to undergo the procedure.                           After obtaining informed consent, the endoscope was  passed under direct vision. Throughout the                            procedure, the patient's blood pressure, pulse, and                            oxygen saturations were monitored continuously. The                            GIF [id number] #[id number] was introduced through the                            mouth, and advanced to the third part of duodenum.                            The  upper GI endoscopy was accomplished without                            difficulty. The patient tolerated the procedure                            well. Scope In: Scope Out: Findings:                 LA Grade A (one or more mucosal breaks less than 5                            mm, not extending between tops of 2 mucosal folds)                            esophagitis was found 36 cm from the incisors.                            Biopsies were taken with a cold forceps for                            histology. Estimated blood loss was minimal.                           Diffuse mildly erythematous mucosa without bleeding                            was found in the gastric body. Biopsies were taken                            from the antrum, body, and fundus with a cold                            forceps for histology. Estimated blood loss was                            minimal.  The examined duodenum was normal. Biopsies were                            taken with a cold forceps for histology. Estimated                            blood loss was minimal.                           The cardia and gastric fundus were normal on                            retroflexion.                           The exam was otherwise without abnormality. Complications:            No immediate complications. Estimated blood loss:                            Minimal. Estimated Blood Loss:     Estimated blood loss: none. Impression:               - LA Grade A esophagitis. Biopsied.                           - Erythematous mucosa in the gastric body. Biopsied.                           - Normal examined duodenum. Biopsied.                           - The examination was otherwise normal. Recommendation:           - Patient has a contact number available for                            emergencies. The signs and symptoms of potential                            delayed complications were discussed  with the                            patient. Return to normal activities tomorrow.                            Written discharge instructions were provided to the                            patient.                           - Resume previous diet.                           - Continue present medications. Increase omeprazole  to 40 mg BID.                           - Avoid all NSAIDs.                           - Await pathology results. Thornton Park MD, MD 01/22/2021 2:22:00 PM This report has been signed electronically.

## 2021-01-22 NOTE — Progress Notes (Signed)
Referring Provider: Vonna Drafts, FNP Primary Care Physician:  Vonna Drafts, FNP  Reason for Procedure:  LUQ pain, painless rectal bleeding   IMPRESSION:  LUQ pain Painless rectal bleeding Appropriate candidate for monitored anesthesia care  PLAN: EGD and Colonoscopy in the [facility] today   HPI: Sydney Beck is a 45 y.o. female presents for endoscopic evaluation of LUQ pain and painless rectal bleeding. See [facility] office consultation note from 12/17/20 for full details. There has been no significant change in history or physical exam.     Past Medical History:  Diagnosis Date   Alcoholism ([place])    Anxiety    Asthma    Depression     Past Surgical History:  Procedure Laterality Date   BREAST CYST ASPIRATION Right    age 29 cyst aspirated   CYST REMOVAL NECK     INGUINAL HIDRADENITIS EXCISION     KNEE SURGERY Right     Current Outpatient Medications  Medication Sig Dispense Refill   cetirizine (ZYRTEC) 10 MG tablet Take 1 tablet (10 mg total) by mouth daily. 90 tablet 3   acetaminophen (TYLENOL) 325 MG tablet Take 2 tablets (650 mg total) by mouth every 6 (six) hours as needed for up to 30 doses for mild pain or moderate pain. 30 tablet 0   albuterol (VENTOLIN HFA) 108 (90 Base) MCG/ACT inhaler Inhale 2 puffs into the lungs every 4 (four) hours as needed for wheezing or shortness of breath (cough, shortness of breath or wheezing.). 6.7 g 1   chlorhexidine (HIBICLENS) 4 % external liquid Hibiclens 4 % topical liquid  Apply 1 application by topical route.     clotrimazole-betamethasone (LOTRISONE) cream Apply 1 application topically 2 (two) times daily. 30 g 2   Current Facility-Administered Medications  Medication Dose Route Frequency Provider Last Rate Last Admin   0.9 %  sodium chloride infusion  500 mL Intravenous Once Thornton Park, MD        Allergies as of 01/22/2021 - Review Complete 01/22/2021  Allergen Reaction Noted   Other Hives  and Swelling 11/04/2019   Bee venom Swelling 12/26/2010   Oxycodone Hives 01/22/2021   Oxycodone hcl Itching 03/03/2015   Sulfa antibiotics  12/26/2010    Family History  Problem Relation Age of Onset   Anxiety disorder Mother    Depression Mother    Diabetes Father    Depression Father    Anxiety disorder Father    Dementia Father    Schizophrenia Brother    Alcohol abuse Brother    Drug abuse Brother    Depression Brother    Anxiety disorder Brother    Colon cancer Neg Hx    Esophageal cancer Neg Hx    Rectal cancer Neg Hx    Stomach cancer Neg Hx      Physical Exam: General:   Alert,  well-nourished, pleasant and cooperative in NAD Head:  Normocephalic and atraumatic. Eyes:  Sclera clear, no icterus.   Conjunctiva pink. Mouth:  No deformity or lesions.   Neck:  Supple; no masses or thyromegaly. Lungs:  Clear throughout to auscultation.   No wheezes. Heart:  Regular rate and rhythm; no murmurs. Abdomen:  Soft, non-tender, nondistended, normal bowel sounds, no rebound or guarding.  Msk:  Symmetrical. No boney deformities LAD: No inguinal or umbilical LAD Extremities:  No clubbing or edema. Neurologic:  Alert and  oriented x4;  grossly nonfocal Skin:  No obvious rash or bruise. Psych:  Alert and cooperative.  Normal mood and affect.     [name] L. Tarri Glenn, MD, MPH 01/22/2021, 1:57 PM

## 2021-01-22 NOTE — Op Note (Signed)
[facility] Patient Name: Sydney Beck Procedure Date: 01/22/2021 1:59 PM MRN: [id number] Endoscopist: Thornton Park MD, MD Age: 45 Referring MD:  Date of Birth: [birth year] Gender: Female Account #: [id number] Procedure:                Colonoscopy Indications:              Abdominal pain in the left upper quadrant, Rectal                            bleeding Medicines:                Monitored Anesthesia Care Procedure:                Pre-Anesthesia Assessment:                           - Prior to the procedure, a History and Physical                            was performed, and patient medications and                            allergies were reviewed. The patient's tolerance of                            previous anesthesia was also reviewed. The risks                            and benefits of the procedure and the sedation                            options and risks were discussed with the patient.                            All questions were answered, and informed consent                            was obtained. Prior Anticoagulants: The patient has                            taken no previous anticoagulant or antiplatelet                            agents. ASA Grade Assessment: III - A patient with                            severe systemic disease. After reviewing the risks                            and benefits, the patient was deemed in                            satisfactory condition to undergo the procedure.  After obtaining informed consent, the colonoscope                            was passed under direct vision. Throughout the                            procedure, the patient's blood pressure, pulse, and                            oxygen saturations were monitored continuously. The                            Olympus CF-[id number]) Colonoscope was                            introduced through the anus and advanced to the  3                            cm into the ileum. A second forward view of the                            right colon. The colonoscopy was performed without                            difficulty. The patient tolerated the procedure                            well. The quality of the bowel preparation was                            good. The terminal ileum, ileocecal valve,                            appendiceal orifice, and rectum were photographed. Scope In: 2:23:43 PM Scope Out: 2:40:27 PM Scope Withdrawal Time: 0 hours 12 minutes 29 seconds  Total Procedure Duration: 0 hours 16 minutes 44 seconds  Findings:                 An anal fissure was found on perianal exam.                           Multiple small and large-mouthed diverticula were                            found in the sigmoid colon, descending colon and                            ascending colon.                           The entired examined colonic mucosa was normal                            except for two small 23mm sigmoid polyps that were  removed with cold forceps. Biopsies were taken from                            the right and left colon with a cold forceps for                            histology. The small polyps were removed with cold                            forceps. Estimated blood loss was minimal.                           A patchy area of mucosa in the terminal ileum was                            mildly erythematous. Biopsies were taken with a                            cold forceps for histology. Estimated blood loss                            was minimal.                           The exam was otherwise without abnormality on                            direct and retroflexion views. Complications:            No immediate complications. Estimated blood loss:                            Minimal. Estimated Blood Loss:     Estimated blood loss was minimal. Impression:                - Anal fissure found on perianal exam.                           - Diverticulosis in the sigmoid colon, in the                            descending colon and in the ascending colon.                           - The entire examined colon is normal. Biopsied.                           - Erythematous mucosa in the terminal ileum.                            Biopsied.                           - The examination was otherwise normal on direct  and retroflexion views. Recommendation:           - Patient has a contact number available for                            emergencies. The signs and symptoms of potential                            delayed complications were discussed with the                            patient. Return to normal activities tomorrow.                            Written discharge instructions were provided to the                            patient.                           - Resume previous diet. High fiber diet recommended.                           - Continue present medications.                           - Avoid all NSAIDs.                           - Add a stool bulking agent such as psyllium or                            methylcellulose.                           - Diltiazem 2% compounded with lidocaine 5%                            ointment applied to rectum 4 times daily.                           - Await pathology results.                           - Repeat colonoscopy in 10 years for surveillance,                            earlier with new symptoms. Thornton Park MD, MD 01/22/2021 2:53:48 PM This report has been signed electronically.

## 2021-01-22 NOTE — Progress Notes (Signed)
Pt in recovery with monitors in place, VSS. Report given to receiving RN. Bite guard was placed with pt awake to ensure comfort. No dental or soft tissue damage noted. 

## 2021-01-22 NOTE — Patient Instructions (Addendum)
Information on polyps and diverticulosis given to you today.  Await pathology results.  Resume previous diet and medications.  Increase Omeprazole to 40 mg twice a day.   Avoid NSAIDS (Aspirin, Ibuprofen, Aleve, Naproxen), you may use Tylenol as needed.  Diltiazem per rectum 4 times a day.  RX is at [facility].     YOU HAD AN ENDOSCOPIC PROCEDURE TODAY AT [facility]:   Refer to the procedure report that was given to you for any specific questions about what was found during the examination.  If the procedure report does not answer your questions, please call your gastroenterologist to clarify.  If you requested that your care partner not be given the details of your procedure findings, then the procedure report has been included in a sealed envelope for you to review at your convenience later.  YOU SHOULD EXPECT: Some feelings of bloating in the abdomen. Passage of more gas than usual.  Walking can help get rid of the air that was put into your GI tract during the procedure and reduce the bloating. If you had a lower endoscopy (such as a colonoscopy or flexible sigmoidoscopy) you may notice spotting of blood in your stool or on the toilet paper. If you underwent a bowel prep for your procedure, you may not have a normal bowel movement for a few days.  Please Note:  You might notice some irritation and congestion in your nose or some drainage.  This is from the oxygen used during your procedure.  There is no need for concern and it should clear up in a day or so.  SYMPTOMS TO REPORT IMMEDIATELY:  Following lower endoscopy (colonoscopy or flexible sigmoidoscopy):  Excessive amounts of blood in the stool  Significant tenderness or worsening of abdominal pains  Swelling of the abdomen that is new, acute  Fever of 100F or higher  Following upper endoscopy (EGD)  Vomiting of blood or coffee ground material  New chest pain or pain under  the shoulder blades  Painful or persistently difficult swallowing  New shortness of breath  Fever of 100F or higher  Black, tarry-looking stools  For urgent or emergent issues, a gastroenterologist can be reached at any hour by calling [phone]. Do not use MyChart messaging for urgent concerns.    DIET:  We do recommend a small meal at first, but then you may proceed to your regular diet.  Drink plenty of fluids but you should avoid alcoholic beverages for 24 hours.  ACTIVITY:  You should plan to take it easy for the rest of today and you should NOT DRIVE or use heavy machinery until tomorrow (because of the sedation medicines used during the test).    FOLLOW UP: Our staff will call the number listed on your records 48-72 hours following your procedure to check on you and address any questions or concerns that you may have regarding the information given to you following your procedure. If we do not reach you, we will leave a message.  We will attempt to reach you two times.  During this call, we will ask if you have developed any symptoms of COVID 19. If you develop any symptoms (ie: fever, flu-like symptoms, shortness of breath, cough etc.) before then, please call [phone].  If you test positive for Covid 19 in the 2 weeks post procedure, please call and report this information to [place].    If any biopsies were taken you will be  contacted by phone or by letter within the next 1-3 weeks.  Please call us at [phone] if you have not heard about the biopsies in 3 weeks.    SIGNATURES/CONFIDENTIALITY: You and/or your care partner have signed paperwork which will be entered into your electronic medical record.  These signatures attest to the fact that that the information above on your After Visit Summary has been reviewed and is understood.  Full responsibility of the confidentiality of this discharge information lies with you and/or your care-partner.

## 2021-01-24 ENCOUNTER — Telehealth: Payer: Self-pay | Admitting: Gastroenterology

## 2021-01-24 MED ORDER — HYDROCORTISONE (PERIANAL) 2.5 % EX CREA: 1.0000 "application " | TOPICAL_CREAM | Freq: Two times a day (BID) | CUTANEOUS | 1 refills | Status: AC

## 2021-01-24 NOTE — Telephone Encounter (Signed)
Patient had colonoscopy with Dr. Tarri Glenn last week.  She is being treated for an anal fissure.  She called complaining of extremely swollen perianal area and what she describes as several pomegranate seed looking protrusions.  She says that the entire area is very swollen.  She denies any fever.  I am going to send hydrocortisone cream for her to use and I recommended trying Desitin/zinc oxide as well.  She may need to be worked into the office on Monday/tomorrow to be evaluated.  Please reach out to her.

## 2021-01-25 ENCOUNTER — Ambulatory Visit: Payer: BC Managed Care – PPO | Admitting: Physician Assistant

## 2021-01-25 ENCOUNTER — Encounter: Payer: Self-pay | Admitting: Physician Assistant

## 2021-01-25 VITALS — BP 120/80 | HR 70 | Ht 68.5 in | Wt 294.0 lb

## 2021-01-25 DIAGNOSIS — K602 Anal fissure, unspecified: Secondary | ICD-10-CM | POA: Diagnosis not present

## 2021-01-25 DIAGNOSIS — K644 Residual hemorrhoidal skin tags: Secondary | ICD-10-CM | POA: Diagnosis not present

## 2021-01-25 NOTE — Telephone Encounter (Signed)
Called pt to follow up. States she has been doing frequent sitz baths, using hydrocortisone cream rectally and has noticed that she has now developed 2 areas of prolapsed rectal tissue that appears to look like blueberries, is painful and swollen. Denies any rectal bleeding and admits she has been constipated. Per Dr. Tarri Glenn and Alonza Bogus, PA, pt will need to be worked in to today's clinic (first available provider) for further evaluation. At their request, pt has been scheduled for 01/25/21 @ 3pm with Ellouise Newer, PA.

## 2021-01-25 NOTE — Patient Instructions (Signed)
Call Monday with an update.   If you are age 44 or older, your body mass index should be between 23-30. Your Body mass index is 44.05 kg/m. If this is out of the aforementioned range listed, please consider follow up with your Primary Care Provider.  If you are age 63 or younger, your body mass index should be between 19-25. Your Body mass index is 44.05 kg/m. If this is out of the aformentioned range listed, please consider follow up with your Primary Care Provider.   ________________________________________________________  The [facility] providers would like to encourage you to use [facility] to communicate with providers for non-urgent requests or questions.  Due to long hold times on the telephone, sending your provider a message by [facility] may be a faster and more efficient way to get a response.  Please allow 48 business hours for a response.  Please remember that this is for non-urgent requests.  _______________________________________________________

## 2021-01-25 NOTE — Progress Notes (Signed)
Reviewed and agree with management plans. ? ?Sydney Beck L. Jerrell Mangel, MD, MPH  ?

## 2021-01-25 NOTE — Progress Notes (Signed)
Chief Complaint: Painful hemorrhoids  HPI:    Mrs. Peden is a 45 year old African-American female with past medical history as listed below, known to Dr. Tarri Glenn, who presents to clinic for urgent add-on appointment, with a complaint of painful hemorrhoids.    01/22/2021 EGD with LA grade a esophagitis, erythematous mucosa in the gastric body and otherwise normal.  Patient had increase of Omeprazole to 40 mg twice daily.    01/22/2021 colonoscopy with anal fissure, diverticulosis in the sigmoid, descending and ascending colon and erythematous mucosa in the terminal ileum.  Told to add fiber.  Also prescribed Diltiazem 2% compounded with lidocaine 5% ointment applied to rectum 4 times daily.    01/24/2021 patient called on-call service and described being treated for an anal fissure.  She was complaining of extremely swollen perianal area and what she described as several pomegranate seeds looking protrusions.  She was sent in some Hydrocortisone cream for her to use and recommended she try Desitin/zinc oxide as well.    01/25/2021 patient was called this morning describe doing frequent sitz bath's, using Hydrocortisone cream rectally did notice that she had now developed 2 areas of prolapsed rectal tissue that appeared to look like blueberries, very painful and swollen.  She was arranged for appointment today.    Today, the patient presents to clinic and tells me that since the time of her colonoscopy on 01/22/2021 she has had rectal pain and noticed two "bulges" in her rectum that were very painful to touch and worse if she had to sit on her bottom all day.  Over the weekend she has been using her Diltiazem as prescribed for fissure and also her Hydrocortisone as called in yesterday.  Has also been doing sitz bath's frequently.  Tells me that initially these were very hard and even more painful and they seem to have gotten softer over the past 24 to 48 hours.  Denies seeing any bright red blood.    Also  describes a boil higher up in her gluteal cleft today.    Denies fever, chills, weight loss or blood in her stool.  Past Medical History:  Diagnosis Date   Alcoholism ([place])    Anxiety    Asthma    Depression     Past Surgical History:  Procedure Laterality Date   BREAST CYST ASPIRATION Right    age 7 cyst aspirated   CYST REMOVAL NECK     INGUINAL HIDRADENITIS EXCISION     KNEE SURGERY Right     Current Outpatient Medications  Medication Sig Dispense Refill   acetaminophen (TYLENOL) 325 MG tablet Take 2 tablets (650 mg total) by mouth every 6 (six) hours as needed for up to 30 doses for mild pain or moderate pain. 30 tablet 0   albuterol (VENTOLIN HFA) 108 (90 Base) MCG/ACT inhaler Inhale 2 puffs into the lungs every 4 (four) hours as needed for wheezing or shortness of breath (cough, shortness of breath or wheezing.). 6.7 g 1   AMBULATORY NON FORMULARY MEDICATION Place 1 applicator rectally 4 (four) times daily. Medication Name: Diltiazem 2% and Lidocaine 5% 30 g 0   cetirizine (ZYRTEC) 10 MG tablet Take 1 tablet (10 mg total) by mouth daily. 90 tablet 3   chlorhexidine (HIBICLENS) 4 % external liquid Hibiclens 4 % topical liquid  Apply 1 application by topical route.     clotrimazole-betamethasone (LOTRISONE) cream Apply 1 application topically 2 (two) times daily. 30 g 2   hydrocortisone (ANUSOL-HC) 2.5 % rectal  cream Place 1 application rectally 2 (two) times daily. 30 g 1   omeprazole (PRILOSEC) 40 MG capsule Take 1 capsule (40 mg total) by mouth 2 (two) times daily. 90 capsule 3   No current facility-administered medications for this visit.    Allergies as of 01/25/2021 - Review Complete 01/22/2021  Allergen Reaction Noted   Other Hives and Swelling 11/04/2019   Bee venom Swelling 12/26/2010   Oxycodone Hives 01/22/2021   Oxycodone hcl Itching 03/03/2015   Sulfa antibiotics  12/26/2010    Family History  Problem Relation Age of Onset   Anxiety disorder Mother     Depression Mother    Diabetes Father    Depression Father    Anxiety disorder Father    Dementia Father    Schizophrenia Brother    Alcohol abuse Brother    Drug abuse Brother    Depression Brother    Anxiety disorder Brother    Colon cancer Neg Hx    Esophageal cancer Neg Hx    Rectal cancer Neg Hx    Stomach cancer Neg Hx     Social History   Socioeconomic History   Marital status: Single    Spouse name: Not on file   Number of children: 0   Years of education: Not on file   Highest education level: Some college, no degree  Occupational History   Not on file  Tobacco Use   Smoking status: Light Smoker    Types: Cigarettes   Smokeless tobacco: Never   Tobacco comments:    socical - black and milds  Vaping Use   Vaping Use: Some days   Devices: occ  Substance and Sexual Activity   Alcohol use: Yes    Alcohol/week: 20.0 standard drinks    Types: 15 Glasses of wine, 5 Shots of liquor per week   Drug use: Yes    Types: Marijuana    Comment: used on a weekly basis   Sexual activity: Yes  Other Topics Concern   Not on file  Social History Narrative   Not on file   Social Determinants of Health   Financial Resource Strain: Not on file  Food Insecurity: Not on file  Transportation Needs: Not on file  Physical Activity: Not on file  Stress: Not on file  Social Connections: Not on file  Intimate Partner Violence: Not on file    Review of Systems:    Constitutional: No fever or chills Cardiovascular: No chest pain Respiratory: No SOB  Gastrointestinal: See HPI and otherwise negative   Physical Exam:  Vital signs: BP 120/80   Pulse 70   Ht 5' 8.5" (1.74 m)   Wt 294 lb (133.4 kg)   LMP 12/28/2020   BMI 44.05 kg/m    Constitutional:   Pleasant obese AA female appears to be in NAD, Well developed, Well nourished, alert and cooperative Respiratory: Respirations even and unlabored. Lungs clear to auscultation bilaterally.   No wheezes, crackles, or rhonchi.   Cardiovascular: Normal S1, S2. No MRG. Regular rate and rhythm. No peripheral edema, cyanosis or pallor.  Gastrointestinal:  Soft, nondistended, nontender. No rebound or guarding. Normal bowel sounds. No appreciable masses or hepatomegaly. Rectal: External: 2 nonthrombosed hemorrhoids, anal fissure; internal: Deferred due to pain and recent colonoscopy; also boil higher up in gluteal cleft tender to palpation with no head Psychiatric: Demonstrates good judgement and reason without abnormal affect or behaviors.  Assessment: 1.  External hemorrhoids: Worse on 12/9, now softened at time of  exam, still very tender 2.  Anal fissure: Diagnosed at time of recent colonoscopy currently on Diltiazem over the past 3 days  Plan: 1.  Discussed with patient that I believe she did have thrombosed external hemorrhoids initially, but these are soft at time of exam today and tenderness has gotten some better over the past 3 days.  She just started using Hydrocortisone cream yesterday.  Explained that it can take some time for these to go back to completely normal but I do not think she would benefit from an I&D at this point.  Told her to continue with the Hydrocortisone twice daily and the Diltiazem for treatment of known fissure.  Explained fissures can take 6 to 8 weeks to heal completely.  Also encouraged continued sitz bath's. 2.  Patient will call our clinic on Monday, 02/01/2021 if she is still not having any relief at that point would recommend referral to [facility].  I do not think this will occur.  She would likely get better over the next few days.  Ellouise Newer, PA-C [facility] 01/25/2021, 3:05 PM  Cc: Vonna Drafts, FNP

## 2021-01-26 ENCOUNTER — Telehealth: Payer: Self-pay | Admitting: *Deleted

## 2021-01-26 ENCOUNTER — Telehealth: Payer: Self-pay

## 2021-01-26 NOTE — Telephone Encounter (Signed)
°  Follow up Call-  Call back number 01/22/2021  Post procedure Call Back phone  # [phone]  Permission to leave phone message Yes  Some recent data might be hidden     Patient questions:  Do you have a fever, pain , or abdominal swelling? No. Pain Score  0 *  Have you tolerated food without any problems? Yes.    Have you been able to return to your normal activities? Yes.    Do you have any questions about your discharge instructions: Diet   Yes.   Medications  Yes.   Follow up visit  Yes.    Do you have questions or concerns about your Care? No.  Actions: * If pain score is 4 or above: No action needed, pain <4.  Patient states hemorrhoids are improving with sitz bath and diltiazem ointment

## 2021-01-26 NOTE — Telephone Encounter (Signed)
Left message on follow up call. 

## 2021-01-29 ENCOUNTER — Encounter: Payer: Self-pay | Admitting: Gastroenterology

## 2021-03-04 ENCOUNTER — Ambulatory Visit: Payer: BC Managed Care – PPO | Admitting: Nurse Practitioner

## 2021-07-08 ENCOUNTER — Other Ambulatory Visit: Payer: Self-pay | Admitting: Chiropractic Medicine

## 2021-07-08 ENCOUNTER — Ambulatory Visit
Admission: RE | Admit: 2021-07-08 | Discharge: 2021-07-08 | Disposition: A | Discharge status: Home / Self Care | Payer: BC Managed Care – PPO | Source: Ambulatory Visit | Attending: Chiropractic Medicine | Admitting: Chiropractic Medicine

## 2021-07-08 DIAGNOSIS — M549 Dorsalgia, unspecified: Secondary | ICD-10-CM

## 2021-07-08 DIAGNOSIS — M542 Cervicalgia: Secondary | ICD-10-CM
